# Patient Record
Sex: Female | Born: 1945
Health system: Southern US, Community
[De-identification: ages and names within clinical notes are randomized; demographics above are authoritative.]

## PROBLEM LIST (undated history)

## (undated) DIAGNOSIS — Z961 Presence of intraocular lens: Secondary | ICD-10-CM

## (undated) DIAGNOSIS — M810 Age-related osteoporosis without current pathological fracture: Secondary | ICD-10-CM

## (undated) DIAGNOSIS — D072 Carcinoma in situ of vagina: Secondary | ICD-10-CM

## (undated) DIAGNOSIS — A64 Unspecified sexually transmitted disease: Secondary | ICD-10-CM

## (undated) DIAGNOSIS — R87619 Unspecified abnormal cytological findings in specimens from cervix uteri: Secondary | ICD-10-CM

## (undated) DIAGNOSIS — I10 Essential (primary) hypertension: Secondary | ICD-10-CM

## (undated) HISTORY — PX: CATARACT EXTRACTION: SUR2

## (undated) HISTORY — DX: Presence of intraocular lens: Z96.1

## (undated) HISTORY — DX: Carcinoma in situ of vagina: D07.2

## (undated) HISTORY — DX: Unspecified sexually transmitted disease: A64

## (undated) HISTORY — DX: Essential (primary) hypertension: I10

## (undated) HISTORY — PX: FOOT SURGERY: SHX648

## (undated) HISTORY — DX: Unspecified abnormal cytological findings in specimens from cervix uteri: R87.619

## (undated) HISTORY — PX: TUBAL LIGATION: SHX77

## (undated) HISTORY — DX: Age-related osteoporosis without current pathological fracture: M81.0

## (undated) HISTORY — PX: MANDIBLE FRACTURE SURGERY: SHX706

## (undated) HISTORY — PX: COLPOSCOPY: SHX161

---

## 1998-06-09 ENCOUNTER — Other Ambulatory Visit: Admission: RE | Admit: 1998-06-09 | Discharge: 1998-06-09 | Payer: Self-pay | Admitting: *Deleted

## 1999-06-09 ENCOUNTER — Other Ambulatory Visit: Admission: RE | Admit: 1999-06-09 | Discharge: 1999-06-09 | Payer: Self-pay | Admitting: Obstetrics and Gynecology

## 2001-07-02 ENCOUNTER — Other Ambulatory Visit: Admission: RE | Admit: 2001-07-02 | Discharge: 2001-07-02 | Payer: Self-pay | Admitting: Obstetrics and Gynecology

## 2002-07-09 ENCOUNTER — Other Ambulatory Visit: Admission: RE | Admit: 2002-07-09 | Discharge: 2002-07-09 | Payer: Self-pay | Admitting: Obstetrics and Gynecology

## 2003-07-11 ENCOUNTER — Other Ambulatory Visit: Admission: RE | Admit: 2003-07-11 | Discharge: 2003-07-11 | Payer: Self-pay | Admitting: Obstetrics and Gynecology

## 2004-03-10 ENCOUNTER — Ambulatory Visit (HOSPITAL_COMMUNITY): Admission: RE | Admit: 2004-03-10 | Discharge: 2004-03-10 | Payer: Self-pay | Admitting: Gastroenterology

## 2004-08-31 ENCOUNTER — Other Ambulatory Visit: Admission: RE | Admit: 2004-08-31 | Discharge: 2004-08-31 | Payer: Self-pay | Admitting: Obstetrics and Gynecology

## 2005-08-30 ENCOUNTER — Other Ambulatory Visit: Admission: RE | Admit: 2005-08-30 | Discharge: 2005-08-30 | Payer: Self-pay | Admitting: Obstetrics and Gynecology

## 2006-09-06 ENCOUNTER — Encounter: Payer: Self-pay | Admitting: Obstetrics and Gynecology

## 2006-09-06 ENCOUNTER — Other Ambulatory Visit: Admission: RE | Admit: 2006-09-06 | Discharge: 2006-09-06 | Payer: Self-pay | Admitting: Obstetrics and Gynecology

## 2007-09-07 ENCOUNTER — Other Ambulatory Visit: Admission: RE | Admit: 2007-09-07 | Discharge: 2007-09-07 | Payer: Self-pay | Admitting: Obstetrics & Gynecology

## 2010-01-28 ENCOUNTER — Ambulatory Visit (HOSPITAL_COMMUNITY)
Admission: RE | Admit: 2010-01-28 | Discharge: 2010-01-28 | Payer: Self-pay | Source: Home / Self Care | Admitting: Endocrinology

## 2010-03-10 HISTORY — PX: OTHER SURGICAL HISTORY: SHX169

## 2010-08-06 NOTE — Op Note (Signed)
NAMEWANDRA, Renee Watkins                 ACCOUNT NO.:  [id number]   MEDICAL RECORD NO.:  [id number]          PATIENT TYPE:  AMB   LOCATION:  ENDO                         FACILITY:  [facility]   PHYSICIAN:  Danise Edge, M.D.   DATE OF BIRTH:  Apr 13, 1945   DATE OF PROCEDURE:  03/10/2004  DATE OF DISCHARGE:                                 OPERATIVE REPORT   PROCEDURE:  Colonoscopy.   INDICATIONS FOR PROCEDURE:  Renee Watkins is a 65 year old female born 05/30/1945.  Renee Watkins has guaiac-positive stool by stool guaiac testing.   ENDOSCOPIST:  Danise Edge, M.D.   PREMEDICATION:  Versed 5 mg, Demerol 60 mg.   PROCEDURE:  After obtaining informed consent, Renee Watkins was placed in the  left lateral decubitus position.  I administered intravenous Demerol and  intravenous Versed to achieve conscious sedation for the procedure.  The  patient's blood pressure, oxygen saturation, and cardiac rhythm were  monitored throughout the procedure and documented in the medical record.   Anal inspection and digital rectal exam was normal.  The Olympus adjustable  pediatric colonoscope was introduced into the rectum and advanced to the  cecum.  Colonic preparation for the exam today was excellent.   RECTUM:  Normal.   SIGMOID COLON/DESCENDING COLON:  Normal.   SPLENIC FLEXURE:  Normal.   TRANSVERSE COLON:  Normal.   HEPATIC FLEXURE:  Normal.   ASCENDING COLON:  Normal.   CECUM AND ILEOCECAL VALVE:  Normal.   ASSESSMENT:  Normal proctocolonoscopy of the cecum.      MJ/MEDQ  D:  03/10/2004  T:  03/10/2004  Job:  [id number]   cc:   Thora Lance, M.D.  [address]  [place]  [place] [address]  Fax: [phone]

## 2011-02-01 ENCOUNTER — Other Ambulatory Visit (HOSPITAL_COMMUNITY): Payer: Self-pay | Admitting: *Deleted

## 2011-02-03 ENCOUNTER — Encounter (HOSPITAL_COMMUNITY)
Admission: RE | Admit: 2011-02-03 | Discharge: 2011-02-03 | Disposition: A | Discharge status: Home / Self Care | Payer: BC Managed Care – PPO | Source: Ambulatory Visit | Attending: Endocrinology | Admitting: Endocrinology

## 2011-02-03 DIAGNOSIS — M810 Age-related osteoporosis without current pathological fracture: Secondary | ICD-10-CM | POA: Insufficient documentation

## 2011-02-03 MED ORDER — ZOLEDRONIC ACID 5 MG/100ML IV SOLN
5.0000 mg | Freq: Once | INTRAVENOUS | Status: AC
  Administered 2011-02-03: 5 mg via INTRAVENOUS
  Filled 2011-02-03: qty 100

## 2011-03-02 HISTORY — PX: OTHER SURGICAL HISTORY: SHX169

## 2012-03-08 HISTORY — PX: BUNIONECTOMY: SHX129

## 2012-03-08 HISTORY — PX: OTHER SURGICAL HISTORY: SHX169

## 2012-06-13 ENCOUNTER — Other Ambulatory Visit (HOSPITAL_COMMUNITY): Payer: Self-pay | Admitting: *Deleted

## 2012-06-14 ENCOUNTER — Ambulatory Visit (HOSPITAL_COMMUNITY)
Admission: RE | Admit: 2012-06-14 | Discharge: 2012-06-14 | Disposition: A | Discharge status: Home / Self Care | Payer: 59 | Source: Ambulatory Visit | Attending: Endocrinology | Admitting: Endocrinology

## 2012-06-14 ENCOUNTER — Encounter: Payer: Self-pay | Admitting: Podiatry

## 2012-06-14 DIAGNOSIS — M810 Age-related osteoporosis without current pathological fracture: Secondary | ICD-10-CM | POA: Insufficient documentation

## 2012-06-14 MED ORDER — ZOLEDRONIC ACID 5 MG/100ML IV SOLN
5.0000 mg | Freq: Once | INTRAVENOUS | Status: AC
  Administered 2012-06-14: 5 mg via INTRAVENOUS

## 2012-06-14 MED ORDER — ZOLEDRONIC ACID 5 MG/100ML IV SOLN
INTRAVENOUS | Status: AC
  Filled 2012-06-14: qty 100

## 2012-06-22 ENCOUNTER — Encounter: Payer: Self-pay | Admitting: Podiatry

## 2012-06-22 ENCOUNTER — Ambulatory Visit (INDEPENDENT_AMBULATORY_CARE_PROVIDER_SITE_OTHER): Payer: 59 | Admitting: Podiatry

## 2012-06-22 VITALS — BP 113/53 | HR 77 | Ht 64.0 in | Wt 107.0 lb

## 2012-06-22 DIAGNOSIS — B351 Tinea unguium: Secondary | ICD-10-CM

## 2012-06-22 NOTE — Patient Instructions (Addendum)
Seen for fungal nails. Need good trimming. Please brush nail border after each shower and keep dry. May use Tinactin foot power. Return as needed.

## 2012-06-22 NOTE — Progress Notes (Signed)
Subjective: 67 y.o. year old female patient presents complaining of deformed toe nail on left great toe. Patient has had right foot bunionectomy in December 2013 with satisfactory recovery, and back to work now.   Patient Summary List & History reviewed for allergies, medications, medical problems and surgical history.  Review of Systems - General ROS: negative for - chills, fatigue, fever, hot flashes, malaise, night sweats, sleep disturbance, weight gain or weight loss.  Objective: Dermatologic:  Thick yellow deformed nail at distal end with yellow debris left great toe. Proximal nail plate is normal shape and color. All other nails are mildly hypertrophic. Post surgical site of right foot is doing well. Noted of increased stiffness along the incision area.   Vascular: All pedal pulses are palpable. No edema or erythema, no bnormal findings noted.  Orthopedic:  Status post right bunionectomy with maintained correction. Multiple scar contracture from previous digital surgeries on left lesser digits.  Neurologic:  All epicritic and tactile sensations grossly intact.  Assessment: Dystrophic mycotic nail left hallux.  Treatment: Deformed nails debrided. Home care instruction dispensed. Return as needed.

## 2012-09-24 ENCOUNTER — Encounter: Payer: Self-pay | Admitting: Certified Nurse Midwife

## 2012-09-27 ENCOUNTER — Encounter: Payer: Self-pay | Admitting: Certified Nurse Midwife

## 2012-09-27 ENCOUNTER — Ambulatory Visit (INDEPENDENT_AMBULATORY_CARE_PROVIDER_SITE_OTHER): Payer: 59 | Admitting: Certified Nurse Midwife

## 2012-09-27 VITALS — BP 114/70 | HR 68 | Resp 16 | Ht 63.75 in | Wt 111.0 lb

## 2012-09-27 DIAGNOSIS — Z01419 Encounter for gynecological examination (general) (routine) without abnormal findings: Secondary | ICD-10-CM

## 2012-09-27 DIAGNOSIS — Z0000 Encounter for general adult medical examination without abnormal findings: Secondary | ICD-10-CM

## 2012-09-27 LAB — POCT URINALYSIS DIPSTICK
Blood, UA: NEGATIVE
Nitrite, UA: NEGATIVE
Urobilinogen, UA: NEGATIVE
pH, UA: 5

## 2012-09-27 NOTE — Patient Instructions (Signed)

## 2012-09-27 NOTE — Progress Notes (Signed)
67 y.o. [id number] Divorced Asian Fe here for annual exam.   Menopausal no HRT. Denies vaginal bleeding or vaginal dryness. Sees PCP for aex and labs, normal per patient. Hypertension medication change due to hypotension with Lisinopril. PCP management.  No health issues today.  Patient's last menstrual period was 03/22/2003.          Sexually active: no  The current method of family planning is tubal ligation.    Exercising: yes  walk Smoker:  no  Health Maintenance: Pap:09-23-10 neg MMG: 09-13-11 neg Had this am report not in epic Colonoscopy: 2006 IFOB per patient with PCP BMD:  2013  TDaP:  2010 Labs: Poct urine-neg Self breast exam: not done   reports that she has never smoked. She has never used smokeless tobacco. She reports that she does not drink alcohol or use illicit drugs.  Past Medical History  Diagnosis Date  . Hypertension   . Osteoporosis   . STD (sexually transmitted disease)     HSV    Past Surgical History  Procedure Laterality Date  . Foot surgery Right 2000, 2009  . Foot surgery Left 2002, 2006, 2010  . Hammer toe repair Right 03/10/10    right foot # 5  . Base wedge osteomy Right 03/10/10    right foot  . Cotton ost w/graft Right 03/10/10    right foot  . Hammer toe repair Left 03/02/11    left foot 3 . 5  . Capsulotomy ipj Right 03/08/12    Right foot 2  . Bunionectomy Right 03/08/12    Keller right foot  . Tubal ligation    . Mandible fracture surgery      Current Outpatient Prescriptions  Medication Sig Dispense Refill  . calcium carbonate (OS-CAL) 600 MG TABS Take 600 mg by mouth daily.      . Calcium-Vitamin D-Vitamin K 500-500-40 MG-UNT-MCG CHEW Chew by mouth daily.      Marland Kitchen lisinopril (PRINIVIL,ZESTRIL) 10 MG tablet daily.      . Multiple Vitamin (MULTIVITAMIN) tablet Take 1 tablet by mouth daily.      . valACYclovir (VALTREX) 500 MG tablet as needed.      . Vitamin D, Ergocalciferol, (DRISDOL) 50000 UNITS CAPS Take 50,000 Units by mouth  every 30 (thirty) days.       No current facility-administered medications for this visit.    Family History  Problem Relation Age of Onset  . Hypertension Sister   . Hypertension Brother   . Hypertension Mother     ROS:  Pertinent items are noted in HPI.  Otherwise, a comprehensive ROS was negative.  Exam:   BP 114/70  Pulse 68  Resp 16  Ht 5' 3.75" (1.619 m)  Wt 111 lb (50.349 kg)  BMI 19.21 kg/m2  LMP 03/22/2003 Height: 5' 3.75" (161.9 cm)  Ht Readings from Last 3 Encounters:  09/27/12 5' 3.75" (1.619 m)  06/22/12 5\' 4"  (1.626 m)  06/14/12 5\' 2"  (1.575 m)    General appearance: alert, cooperative and appears stated age Head: Normocephalic, without obvious abnormality, atraumatic Neck: no adenopathy, supple, symmetrical, trachea midline and thyroid normal to inspection and palpation Lungs: clear to auscultation bilaterally Breasts: normal appearance, no masses or tenderness, No nipple retraction or dimpling, No nipple discharge or bleeding, No axillary or supraclavicular adenopathy Heart: regular rate and rhythm Abdomen: soft, non-tender; no masses,  no organomegaly Extremities: extremities normal, atraumatic, no cyanosis or edema Skin: Skin color, texture, turgor normal. No rashes or lesions Lymph  nodes: Cervical, supraclavicular, and axillary nodes normal. No abnormal inguinal nodes palpated Neurologic: Grossly normal   Pelvic: External genitalia:  no lesions              Urethra:  normal appearing urethra with no masses, tenderness or lesions              Bartholin's and Skene's: normal                 Vagina: normal appearing vagina with normal color and discharge, no lesions              Cervix: normal, non tender              Pap taken: no Bimanual Exam:  Uterus:  normal size, contour, position, consistency, mobility, non-tender and anteverted              Adnexa: normal adnexa and no mass, fullness, tenderness               Rectovaginal: Confirms                Anus:  normal sphincter tone, no lesions  A:  Well Woman with normal exam  Menopausal not on HRT  Hypertension with PCP management  Osteoporosis with endocrine management with Reclast use  P:   Reviewed health and wellness pertinent to exam  Discussed importance of evaluation if vaginal bleeding occurs.Patient voice understanding.  Continue follow upas indicated with PCP   Pap smear as per guidelines   Mammogram yearly pap smear not taken today  counseled on breast self exam, mammography screening, menopause, osteoporosis, adequate intake of calcium and vitamin D, diet and exercise, Kegel's exercises  return annually or prn  An After Visit Summary was printed and given to the patient.  Reviewed, TL

## 2013-01-22 ENCOUNTER — Other Ambulatory Visit: Payer: Self-pay | Admitting: Internal Medicine

## 2013-01-22 ENCOUNTER — Ambulatory Visit
Admission: RE | Admit: 2013-01-22 | Discharge: 2013-01-22 | Disposition: A | Discharge status: Home / Self Care | Payer: 59 | Source: Ambulatory Visit | Attending: Internal Medicine | Admitting: Internal Medicine

## 2013-01-22 DIAGNOSIS — M545 Low back pain, unspecified: Secondary | ICD-10-CM

## 2013-03-01 ENCOUNTER — Ambulatory Visit: Payer: 59

## 2013-03-01 ENCOUNTER — Ambulatory Visit (INDEPENDENT_AMBULATORY_CARE_PROVIDER_SITE_OTHER): Payer: 59

## 2013-03-01 VITALS — BP 123/83 | HR 94 | Resp 18

## 2013-03-01 DIAGNOSIS — M79609 Pain in unspecified limb: Secondary | ICD-10-CM

## 2013-03-01 DIAGNOSIS — M2011 Hallux valgus (acquired), right foot: Secondary | ICD-10-CM

## 2013-03-01 DIAGNOSIS — M2020 Hallux rigidus, unspecified foot: Secondary | ICD-10-CM

## 2013-03-01 DIAGNOSIS — M2021 Hallux rigidus, right foot: Secondary | ICD-10-CM

## 2013-03-01 DIAGNOSIS — M2010 Hallux valgus (acquired), unspecified foot: Secondary | ICD-10-CM

## 2013-03-01 DIAGNOSIS — M1990 Unspecified osteoarthritis, unspecified site: Secondary | ICD-10-CM

## 2013-03-01 NOTE — Patient Instructions (Signed)
Pre-Operative Instructions  Congratulations, you have decided to take an important step to improving your quality of life.  You can be assured that the doctors of [facility] will be with you every step of the way.  1. Plan to be at the surgery center/hospital at least 1 (one) hour prior to your scheduled time unless otherwise directed by the surgical center/hospital staff.  You must have a responsible adult accompany you, remain during the surgery and drive you home.  Make sure you have directions to the surgical center/hospital and know how to get there on time. 2. For hospital based surgery you will need to obtain a history and physical form from your family physician within 1 month prior to the date of surgery- we will give you a form for you primary physician.  3. We make every effort to accommodate the date you request for surgery.  There are however, times where surgery dates or times have to be moved.  We will contact you as soon as possible if a change in schedule is required.   4. No Aspirin/Ibuprofen for one week before surgery.  If you are on aspirin, any non-steroidal anti-inflammatory medications (Mobic, Aleve, Ibuprofen) you should stop taking it 7 days prior to your surgery.  You make take Tylenol  For pain prior to surgery.  5. Medications- If you are taking daily heart and blood pressure medications, seizure, reflux, allergy, asthma, anxiety, pain or diabetes medications, make sure the surgery center/hospital is aware before the day of surgery so they may notify you which medications to take or avoid the day of surgery. 6. No food or drink after midnight the night before surgery unless directed otherwise by surgical center/hospital staff. 7. No alcoholic beverages 24 hours prior to surgery.  No smoking 24 hours prior to or 24 hours after surgery. 8. Wear loose pants or shorts- loose enough to fit over bandages, boots, and casts. 9. No slip on shoes, sneakers are best. 10. Bring  your boot with you to the surgery center/hospital.  Also bring crutches or a walker if your physician has prescribed it for you.  If you do not have this equipment, it will be provided for you after surgery. 11. If you have not been contracted by the surgery center/hospital by the day before your surgery, call to confirm the date and time of your surgery. 12. Leave-time from work may vary depending on the type of surgery you have.  Appropriate arrangements should be made prior to surgery with your employer. 13. Prescriptions will be provided immediately following surgery by your doctor.  Have these filled as soon as possible after surgery and take the medication as directed. 14. Remove nail polish on the operative foot. 15. Wash the night before surgery.  The night before surgery wash the foot and leg well with the antibacterial soap provided and water paying special attention to beneath the toenails and in between the toes.  Rinse thoroughly with water and dry well with a towel.  Perform this wash unless told not to do so by your physician.  Enclosed: 1 Ice pack (please put in freezer the night before surgery)   1 Hibiclens skin cleaner   Pre-op Instructions  If you have any questions regarding the instructions, do not hesitate to call our office.  [place]: [address] [place], [place] [address] [phone]  [place]: [address] [place], [place] [address] [phone]  [place]: [address]  , [place] [address] [phone]  Dr. Ashli Selders   Tuchman DPM, Dr. Norman Regal DPM Dr. Pebbles Zeiders DPM, Dr. M. Todd Hyatt DPM, Dr. Kathryn Egerton DPM 

## 2013-03-01 NOTE — Progress Notes (Signed)
   Subjective:    Patient ID: Renee Watkins, female    DOB: September 01, 1945, 67 y.o.   MRN: [id number]  HPI my right foot had surgery on it two times and was done by Dr Raynald Kemp and the big toe goes up and doesn't want to lay done and hurts with shoes and has been going on for about 3 months ago    Review of Systems  Constitutional: Negative.   HENT: Negative.   Eyes: Negative.   Respiratory: Negative.   Cardiovascular: Negative.   Gastrointestinal: Negative.   Endocrine: Negative.   Genitourinary: Negative.   Musculoskeletal: Negative.   Skin: Negative.   Allergic/Immunologic: Negative.   Neurological: Negative.   Hematological: Bruises/bleeds easily.  Psychiatric/Behavioral: Negative.        Objective:   Physical Exam Vascular status is intact with pedal pulses palpable DP +2/4 bilateral PT plus one over 4 bilateral. Capillary fill time 3 seconds all digits. Skin temperature warm turgor normal no edema rubor noted mild varicosities noted. Neurologically epicritic and proprioceptive sensations appear to be intact and symmetric bilateral. There is normal plantar response DTRs not elicited. Dermatologically skin color pigment normal hair growth absent nails somewhat criptotic. Bare patient has had multiple surgeries for hammertoe repair digital contractures as well as previous bunion surgeries significant for recent bunion surgeries on her right first MTP joint. Past surgeries were done by Dr. Salli Real. X-rays reviewed at this time reveal residual hallux abductovalgus deformity as well as hallux rigidus and lateral deviation of hallux. Patient has undergone capital fragment osteotomy as well and discolored bunionectomy as she's had multiple surgeries on the great toe joint. The toe clinically is in a lateral deviation pushing up against the second digit and overlapping the second toe right foot. Patient difficulty with ambulation and with any enclosed shoes as a result of this deformity. It is  semirigid in nonreducible. She's tried toe separators silicone pads cushions and changing shoes with no success. Patient still has a retained screw fixation of the second digit which also underwent hammertoe repair patient's other digits on his foot as well as digits on the contralateral foot show no deformities and abnormalities consistent with osteoarthritic changes. There is no open wounds or ulcerations noted current time no secondary infection is noted.       Assessment & Plan:  Assessment this time is hallux rigidus with osteoarthropathy and hallux valgus deformity first MTP area right foot. Patient apparently said failed previous surgeries and has residual lateral deviation of the hallux and overlapping of the second toe. Based on her history of arthropathy as well as recurrent valgus deformity and osteoarthritis my recommendation at this time is either conservative care with toe separators padding and accommodative shoes which was explained to the patient. Patient declined this option and instead requested surgical correction which I explained to her would involve arthrodesis or fusion of the MTP joint with screw placement patient understands she will lose flexed any residual flexibility of the toe however Tobia in a permanent rectus position with slight elevation. Patient at this time request surgery and a consent form for first MTP fusion with screw fixation is reviewed and signed. All questions asked medication are answered there no contraindications to surgery at this time. Surgery scheduled her convenience with appropriate followup thereafter.  Alvan Dame DPM

## 2013-03-06 ENCOUNTER — Telehealth: Payer: Self-pay | Admitting: *Deleted

## 2013-03-06 NOTE — Telephone Encounter (Signed)
Pt want to know what kind of shoe will she be wearing after surgery.  I told her she will be wearing aair fracture walker boot to the knee, then later transfer to a surgical sandal.  Pt wanted what she could wear after the hammer toe was fixed.  I told her she wouldn't be able to wear heels higher than a athletic shoe heel and stiffer shoes.

## 2013-03-06 NOTE — Telephone Encounter (Signed)
Pt states she has questions she couldn't ask Dr Ralene Cork.

## 2013-03-28 ENCOUNTER — Encounter: Payer: Self-pay | Admitting: Podiatry

## 2013-03-28 ENCOUNTER — Ambulatory Visit (INDEPENDENT_AMBULATORY_CARE_PROVIDER_SITE_OTHER): Payer: 59 | Admitting: Podiatry

## 2013-03-28 VITALS — BP 132/69 | HR 86 | Resp 12

## 2013-03-28 DIAGNOSIS — M2021 Hallux rigidus, right foot: Secondary | ICD-10-CM

## 2013-03-28 DIAGNOSIS — M2020 Hallux rigidus, unspecified foot: Secondary | ICD-10-CM

## 2013-03-28 NOTE — Progress Notes (Signed)
   Subjective:    Patient ID: Renee Watkins, female    DOB: [birth year], 68 y.o.   MRN: [id number]  HPI Comments: '' RT FOOT BIG TOE STILL HURTS.''  Toe Pain       Review of Systems     Objective:   Physical Exam: I have reviewed her past medical history medications allergies surgeries and social history. She still complaining of a painful first metatarsophalangeal joint right foot. I have read Dr. Erasmo Downer findings and agree. Previous surgeries have now left this metatarsophalangeal joint with a malalignment and bone to bone contact the first metatarsal is staked. After reviewing the radiographs this is my opinion and arthrodesis would be necessary to eliminate her symptoms.        Assessment & Plan:  Assessment: Osteoarthritis first metatarsophalangeal joint right foot.  Plan: Suggested that she followup with Dr. Blenda Mounts for a arthrodesis of her first metatarsophalangeal joint.

## 2013-04-12 ENCOUNTER — Ambulatory Visit (INDEPENDENT_AMBULATORY_CARE_PROVIDER_SITE_OTHER): Payer: 59

## 2013-04-12 VITALS — BP 126/67 | HR 85 | Resp 16

## 2013-04-12 DIAGNOSIS — M2010 Hallux valgus (acquired), unspecified foot: Secondary | ICD-10-CM

## 2013-04-12 DIAGNOSIS — Z472 Encounter for removal of internal fixation device: Secondary | ICD-10-CM

## 2013-04-12 DIAGNOSIS — M79609 Pain in unspecified limb: Secondary | ICD-10-CM

## 2013-04-12 DIAGNOSIS — M2020 Hallux rigidus, unspecified foot: Secondary | ICD-10-CM

## 2013-04-12 DIAGNOSIS — M1990 Unspecified osteoarthritis, unspecified site: Secondary | ICD-10-CM

## 2013-04-12 NOTE — Progress Notes (Signed)
   Subjective:    Patient ID: Renee Watkins, female    DOB: Oct 11, 1945, 68 y.o.   MRN: [id number]  HPI Comments: "My right big toe still bothers me. Guess I will talk about doing the surgery."  patient been see by myself as well as Dr. Milinda Pointer who concurred with my original recommendation for first MTP joint fusion right foot.    Review of Systems no new changes or findings at this time     Objective:   Physical Exam Vascular status at the time is intact pedal pulses are palpable epicritic and proprioceptive sensations intact and symmetric bilateral. There is normal plantar response and DTRs. Patient continues to have pain lateral deviation of the hallux at first MTP joint right foot x-ray from previous visits are reviewed revealing previous Liane Comber as well as Jake Michaelis bunionectomy procedures which appeared to fail to resolve the problem continues have lateral deviation of the hallux. Patient is also having pain in distal tuft the second digit where has retained screw fixation and hammertoe repair second right the option of removing the screw was also offered to the patient at this time. The end of the toe is bumping up and her shoes and causing some irritation keratoses. X-rays confirm it may be prominence of the screw head. There are no contraindications to surgery at this time we discussed the surgery the risks complications and alternatives patient understands she will be nonweightbearing for 4-6 week. Utilizing the air fracture boot and crutches she will be in the boot for a total of of 8-10 weeks for healing       Assessment & Plan:  Assessments at this time hallux rigidus and osteoarthropathy first MTP area right foot with residual hallux valgus deformity. Patient also some irritation from screw fixation second toe right foot. At this time consent forms are reviewed on the new consent form for first MTP fusion with screw fixation right foot as well as removal of old fixation screw second toe right  foot consent forms are reviewed and signed all questions asked medication are answered there no contraindications of surgery we scheduled at her convenience. Again nonweightbearing for 4-6 week duration in air fracture boot understands she will have a rigid hallux MTP joint.  Harriet Masson DPM

## 2013-04-12 NOTE — Patient Instructions (Addendum)
Pre-Operative Instructions  Congratulations, you have decided to take an important step to improving your quality of life.  You can be assured that the doctors of [facility] will be with you every step of the way.  1. Plan to be at the surgery center/hospital at least 1 (one) hour prior to your scheduled time unless otherwise directed by the surgical center/hospital staff.  You must have a responsible adult accompany you, remain during the surgery and drive you home.  Make sure you have directions to the surgical center/hospital and know how to get there on time. 2. For hospital based surgery you will need to obtain a history and physical form from your family physician within 1 month prior to the date of surgery- we will give you a form for you primary physician.  3. We make every effort to accommodate the date you request for surgery.  There are however, times where surgery dates or times have to be moved.  We will contact you as soon as possible if a change in schedule is required.   4. No Aspirin/Ibuprofen for one week before surgery.  If you are on aspirin, any non-steroidal anti-inflammatory medications (Mobic, Aleve, Ibuprofen) you should stop taking it 7 days prior to your surgery.  You make take Tylenol  For pain prior to surgery.  5. Medications- If you are taking daily heart and blood pressure medications, seizure, reflux, allergy, asthma, anxiety, pain or diabetes medications, make sure the surgery center/hospital is aware before the day of surgery so they may notify you which medications to take or avoid the day of surgery. 6. No food or drink after midnight the night before surgery unless directed otherwise by surgical center/hospital staff. 7. No alcoholic beverages 24 hours prior to surgery.  No smoking 24 hours prior to or 24 hours after surgery. 8. Wear loose pants or shorts- loose enough to fit over bandages, boots, and casts. 9. No slip on shoes, sneakers are best. 10. Bring  your boot with you to the surgery center/hospital.  Also bring crutches or a walker if your physician has prescribed it for you.  If you do not have this equipment, it will be provided for you after surgery. 11. If you have not been contracted by the surgery center/hospital by the day before your surgery, call to confirm the date and time of your surgery. 12. Leave-time from work may vary depending on the type of surgery you have.  Appropriate arrangements should be made prior to surgery with your employer. 13. Prescriptions will be provided immediately following surgery by your doctor.  Have these filled as soon as possible after surgery and take the medication as directed. 14. Remove nail polish on the operative foot. 15. Wash the night before surgery.  The night before surgery wash the foot and leg well with the antibacterial soap provided and water paying special attention to beneath the toenails and in between the toes.  Rinse thoroughly with water and dry well with a towel.  Perform this wash unless told not to do so by your physician.  Enclosed: 1 Ice pack (please put in freezer the night before surgery)   1 Hibiclens skin cleaner may substitute any antibacterial liquid soap or cleanser   Pre-op Instructions  If you have any questions regarding the instructions, do not hesitate to call our office.  [place]: [place], [place] [address] [place]: [address], [place], [place] [address] [phone]  [place]: [address]  [place], [place] [address] [phone]  Dr. Kendell Bane DPM, Dr. Ila Mcgill DPM Dr. Harriet Masson DPM, Dr. Lanelle Bal DPM, Dr. Trudie Buckler [facility] Instructions  Congratulations, you have decided to take an important step to improving your quality of life.  You can be assured that the doctors of [facility] will be with you every step of the way.  16. Plan to be at the surgery center/hospital at least 1 (one) hour  prior to your scheduled time unless otherwise directed by the surgical center/hospital staff.  You must have a responsible adult accompany you, remain during the surgery and drive you home.  Make sure you have directions to the surgical center/hospital and know how to get there on time. 53. For hospital based surgery you will need to obtain a history and physical form from your family physician within 1 month prior to the date of surgery- we will give you a form for you primary physician.  18. We make every effort to accommodate the date you request for surgery.  There are however, times where surgery dates or times have to be moved.  We will contact you as soon as possible if a change in schedule is required.   19. No Aspirin/Ibuprofen for one week before surgery.  If you are on aspirin, any non-steroidal anti-inflammatory medications (Mobic, Aleve, Ibuprofen) you should stop taking it 7 days prior to your surgery.  You make take Tylenol  For pain prior to surgery.  20. Medications- If you are taking daily heart and blood pressure medications, seizure, reflux, allergy, asthma, anxiety, pain or diabetes medications, make sure the surgery center/hospital is aware before the day of surgery so they may notify you which medications to take or avoid the day of surgery. 21. No food or drink after midnight the night before surgery unless directed otherwise by surgical center/hospital staff. 22. No alcoholic beverages 24 hours prior to surgery.  No smoking 24 hours prior to or 24 hours after surgery. 23. Wear loose pants or shorts- loose enough to fit over bandages, boots, and casts. 24. No slip on shoes, sneakers are best. 25. Bring your boot with you to the surgery center/hospital.  Also bring crutches or a walker if your physician has prescribed it for you.  If you do not have this equipment, it will be provided for you after surgery. 41. If you have not been contracted by the surgery center/hospital by the day  before your surgery, call to confirm the date and time of your surgery. 52. Leave-time from work may vary depending on the type of surgery you have.  Appropriate arrangements should be made prior to surgery with your employer. 28. Prescriptions will be provided immediately following surgery by your doctor.  Have these filled as soon as possible after surgery and take the medication as directed. 29. Remove nail polish on the operative foot. 49. Wash the night before surgery.  The night before surgery wash the foot and leg well with the antibacterial soap provided and water paying special attention to beneath the toenails and in between the toes.  Rinse thoroughly with water and dry well with a towel.  Perform this wash unless told not to do so by your physician.  Enclosed: 1 Ice pack (please put in freezer the night before surgery)   1 Hibiclens skin cleaner   Pre-op Instructions  If you have any questions regarding the instructions, do not hesitate to call our office.  [place]: [place].  [place], [place] [address] [place]: [address] [place], [place] [address] [phone]  [place]: [address] [place] [address] [phone]  Dr. Kendell Bane DPM, Dr. Ila Mcgill DPM Dr. Harriet Masson DPM, Dr. Lanelle Bal DPM, Dr. Trudie Buckler DPM

## 2013-04-25 DIAGNOSIS — M2020 Hallux rigidus, unspecified foot: Secondary | ICD-10-CM

## 2013-05-13 DIAGNOSIS — Z472 Encounter for removal of internal fixation device: Secondary | ICD-10-CM

## 2013-05-13 DIAGNOSIS — M2020 Hallux rigidus, unspecified foot: Secondary | ICD-10-CM

## 2013-05-13 DIAGNOSIS — M19079 Primary osteoarthritis, unspecified ankle and foot: Secondary | ICD-10-CM

## 2013-05-21 ENCOUNTER — Ambulatory Visit (INDEPENDENT_AMBULATORY_CARE_PROVIDER_SITE_OTHER): Payer: 59

## 2013-05-21 VITALS — BP 137/84 | HR 88 | Resp 12

## 2013-05-21 DIAGNOSIS — M2020 Hallux rigidus, unspecified foot: Secondary | ICD-10-CM

## 2013-05-21 DIAGNOSIS — Z9889 Other specified postprocedural states: Secondary | ICD-10-CM

## 2013-05-21 DIAGNOSIS — Z472 Encounter for removal of internal fixation device: Secondary | ICD-10-CM

## 2013-05-21 DIAGNOSIS — M1990 Unspecified osteoarthritis, unspecified site: Secondary | ICD-10-CM

## 2013-05-21 DIAGNOSIS — M2010 Hallux valgus (acquired), unspecified foot: Secondary | ICD-10-CM

## 2013-05-21 NOTE — Patient Instructions (Signed)

## 2013-05-21 NOTE — Progress Notes (Signed)
   Subjective:    Patient ID: Renee Watkins, female    DOB: 02-12-1946, 68 y.o.   MRN: [id number]  HPI Comments: "  DOS 05-13-2013 POV Hallux fusion and removal screw 2nd toe right      Review of Systems no new changes or find     Objective:   Physical Exam Patient is a day status post hallux fusion right as well as removal screw fixation second right x-rays reveal good position the osteotomy intact fixations noted absence of fixation second toe is noted. Sutures intact and dry dressings intact and dry presto compressive dressing reapplied at this time. Maintain nonweightbearing for lesion of the 3-4 more weeks patient and leg with her air fracture boot and crutches as instructed. Is good clinical and radiographic alignment noted this time no dehiscence minimal ecchymosis mild edema consistent with postop course to       Assessment & Plan:  Assessment good postop progress reappointed one week plan for suture removal at that time maintain nonweightbearing as instructed keep the dressings clean and dry patient's daughter is with her at visit we discussed taking aspirin a day to prevent any clots etc. I suggest either the Advil a day or an aspirin days beneficial. Reappointed one week for suture removal  Harriet Masson DPM

## 2013-05-28 ENCOUNTER — Ambulatory Visit (INDEPENDENT_AMBULATORY_CARE_PROVIDER_SITE_OTHER): Payer: 59

## 2013-05-28 VITALS — BP 128/57 | HR 86 | Resp 12

## 2013-05-28 DIAGNOSIS — Z9889 Other specified postprocedural states: Secondary | ICD-10-CM

## 2013-05-28 DIAGNOSIS — M2020 Hallux rigidus, unspecified foot: Secondary | ICD-10-CM

## 2013-05-28 DIAGNOSIS — M1990 Unspecified osteoarthritis, unspecified site: Secondary | ICD-10-CM

## 2013-05-28 NOTE — Patient Instructions (Addendum)
ICE INSTRUCTIONS  Apply ice or cold pack to the affected area at least 3 times a day for 10-15 minutes each time.  You should also use ice after prolonged activity or vigorous exercise.  Do not apply ice longer than 20 minutes at one time.  Always keep a cloth between your skin and the ice pack to prevent burns.  Being consistent and following these instructions will help control your symptoms.  We suggest you purchase a gel ice pack because they are reusable and do bit leak.  Some of them are designed to wrap around the area.  Use the method that works best for you.  Here are some other suggestions for icing.   Use a frozen bag of peas or corn-inexpensive and molds well to your body, usually stays frozen for 10 to 20 minutes.  Wet a towel with cold water and squeeze out the excess until it's damp.  Place in a bag in the freezer for 20 minutes. Then remove and use.   Maintain nonweightbearing for at least 3 weeks at this time. May resume normal bathing and hygiene starting tomorrow wash and dry the foot however no weightbearing without the boot on use crutches at all times . Followup in 3-4 weeks for followup x-rays

## 2013-05-28 NOTE — Progress Notes (Signed)
   Subjective:    Patient ID: Renee Watkins, female    DOB: 1945/12/16, 68 y.o.   MRN: [id number]  HPI Comments:   DOS 05-13-2013 POV Hallux fusion and screw removal 2nd toe right Suture removal     Review of Systems     Objective:   Physical Exam        Assessment & Plan:

## 2013-05-28 NOTE — Progress Notes (Signed)
   Subjective:    Patient ID: Nohelia Valenza, female    DOB: [birth year], 68 y.o.   MRN: [id number]  HPI DOS 05-13-2013 POV Hallux fusion and screw removal 2nd toe right '' rt foot is doing ok.''    Review of Systems no new changes or findings     Objective:   Physical Exam Neurovascular status is intact pedal pulses palpable epicritic and proprioceptive sensations intact incision is well coapted sutures second toe are removed at this time. Distal tuft second toe. Ace anklet is dispensed to maintain compression patient will resume normal bathing and hygiene our maintain nonweightbearing for at least 2-4 weeks until instructed followup again likely in 3 weeks for followup x-rays and reassessment. No dehiscence no discharge or drainage no signs of secondary infection noted       Assessment & Plan:  Assessment good postop progress status post first MTP fusion as well as removal screw fixation second toe maintain nonweightbearing and compressive wrapping the anklet dispensed at this time recheck in 3 or 4 weeks for followup and reassessment maintain nonweightbearing as instructed  Harriet Masson DPM

## 2013-07-08 ENCOUNTER — Ambulatory Visit (INDEPENDENT_AMBULATORY_CARE_PROVIDER_SITE_OTHER): Payer: 59

## 2013-07-08 VITALS — BP 126/81 | HR 81 | Resp 15 | Ht 64.0 in | Wt 110.0 lb

## 2013-07-08 DIAGNOSIS — Z09 Encounter for follow-up examination after completed treatment for conditions other than malignant neoplasm: Secondary | ICD-10-CM

## 2013-07-08 DIAGNOSIS — Z9889 Other specified postprocedural states: Secondary | ICD-10-CM

## 2013-07-08 DIAGNOSIS — M2020 Hallux rigidus, unspecified foot: Secondary | ICD-10-CM

## 2013-07-08 NOTE — Progress Notes (Signed)
   Subjective:    Patient ID: Kenslei Hearty, female    DOB: [birth year], 68 y.o.   MRN: [id number]  HPI patient presents this time for postop visit she is 2 months status post first MTP fusion with screw fixation right great toe removal screw fixation second digit right foot. Patient still using the boot and crutches has been nonweightbearing as instructed    Review of Systems any systemic findings or changes noted     Objective:   Physical Exam Neurovascular status is intact pedal pulses palpable house is rectus as the second digit x-rays reveal intact fixation and consolidation of osteotomy first MTP joint clinically there is good consolidation no movement the hallux is in a good rectus position slight elevation. Patient ready discontinue crutches and discontinue boot read return to work on May 18 without restrictions mesh wear shoes at all times.       Assessment & Plan:  Assessment good postop progress with clinical and radiographic alignment of the hallux is noted recheck in 2 months for long-term postop followup with x-ray contact us if any change difficulties interim. Maintain shoes at all times discontinue boot and crutches at this time.  Harriet Masson DPM

## 2013-07-08 NOTE — Patient Instructions (Signed)
May discontinue the boot and crutches at this time  Plan for return to work on May 18 with no significant restrictions however must wear shoes or athletic shoes or boots at all times cannot walk barefoot  Maintain a good athletic shoe or thick soled sandal or clog such as a Birkenstock or crocs

## 2013-07-12 DIAGNOSIS — Z9889 Other specified postprocedural states: Secondary | ICD-10-CM

## 2013-09-10 ENCOUNTER — Ambulatory Visit (INDEPENDENT_AMBULATORY_CARE_PROVIDER_SITE_OTHER): Payer: 59

## 2013-09-10 VITALS — BP 118/70 | HR 71 | Resp 15 | Ht 64.0 in | Wt 112.0 lb

## 2013-09-10 DIAGNOSIS — M19079 Primary osteoarthritis, unspecified ankle and foot: Secondary | ICD-10-CM

## 2013-09-10 DIAGNOSIS — M2020 Hallux rigidus, unspecified foot: Secondary | ICD-10-CM

## 2013-09-10 DIAGNOSIS — M2021 Hallux rigidus, right foot: Secondary | ICD-10-CM

## 2013-09-10 DIAGNOSIS — Z09 Encounter for follow-up examination after completed treatment for conditions other than malignant neoplasm: Secondary | ICD-10-CM

## 2013-09-10 NOTE — Progress Notes (Signed)
   Subjective:    Patient ID: Renee Watkins, female    DOB: [birth year], 68 y.o.   MRN: [id number]  HPI Comments: Pt states she is doing good, but has swelling at the end of a 8 hour work day.     Review of Systems no new findings or systemic changes noted     Objective:   Physical Exam Neurovascular status is intact pedal pulses palpable bilateral epicritic and proprioceptive sensations intact patient's four-month status post first MTP fusion right foot also removal of K wire screw fixation second toe right foot patient's only complaint this time after standing hour she is still swelling in her feet and also slight hammering of the third toe which has some slight prominence of flexible hammertoe contractures noted hours not painful tender symptomatic. Recommended compression stockings whenever possible       Assessment & Plan:  Assessment good postop progress x-rays will be consolidation of the first MTP fusion with screw stable fixation intact. No pain at the great toe joint slight edema noted at times especially for prolonged standing and he hour shifts. Patient discharged from our care at this time did make recommendations for knee-high support hose whenever possible.  Harriet Masson DPM

## 2013-09-10 NOTE — Patient Instructions (Addendum)
ICE INSTRUCTIONS  Apply ice or cold pack to the affected area at least 3 times a day for 10-15 minutes each time.  You should also use ice after prolonged activity or vigorous exercise.  Do not apply ice longer than 20 minutes at one time.  Always keep a cloth between your skin and the ice pack to prevent burns.  Being consistent and following these instructions will help control your symptoms.  We suggest you purchase a gel ice pack because they are reusable and do bit leak.  Some of them are designed to wrap around the area.  Use the method that works best for you.  Here are some other suggestions for icing.   Use a frozen bag of peas or corn-inexpensive and molds well to your body, usually stays frozen for 10 to 20 minutes.  Wet a towel with cold water and squeeze out the excess until it's damp.  Place in a bag in the freezer for 20 minutes. Then remove and use.  Also recommend wearing support hose whenever possible. This will help reduce the swelling while standing for 8 hour day

## 2013-09-25 ENCOUNTER — Telehealth: Payer: Self-pay | Admitting: Certified Nurse Midwife

## 2013-09-25 NOTE — Telephone Encounter (Signed)
Patient called during lunch wanting to reschedule her upcoming appt on 09/30/13 to 10/02/13. But Debbi is off that day. [email or web address]

## 2013-09-25 NOTE — Telephone Encounter (Signed)
Patient confirmed her appointment 09/30/13 .

## 2013-09-30 ENCOUNTER — Ambulatory Visit (INDEPENDENT_AMBULATORY_CARE_PROVIDER_SITE_OTHER): Payer: 59 | Admitting: Certified Nurse Midwife

## 2013-09-30 ENCOUNTER — Encounter: Payer: Self-pay | Admitting: Certified Nurse Midwife

## 2013-09-30 VITALS — BP 100/64 | HR 80 | Resp 16 | Ht 63.75 in | Wt 113.0 lb

## 2013-09-30 DIAGNOSIS — Z01419 Encounter for gynecological examination (general) (routine) without abnormal findings: Secondary | ICD-10-CM

## 2013-09-30 DIAGNOSIS — R87619 Unspecified abnormal cytological findings in specimens from cervix uteri: Secondary | ICD-10-CM

## 2013-09-30 DIAGNOSIS — Z124 Encounter for screening for malignant neoplasm of cervix: Secondary | ICD-10-CM

## 2013-09-30 DIAGNOSIS — N841 Polyp of cervix uteri: Secondary | ICD-10-CM

## 2013-09-30 DIAGNOSIS — Z0000 Encounter for general adult medical examination without abnormal findings: Secondary | ICD-10-CM

## 2013-09-30 HISTORY — DX: Unspecified abnormal cytological findings in specimens from cervix uteri: R87.619

## 2013-09-30 LAB — POCT URINALYSIS DIPSTICK
BILIRUBIN UA: NEGATIVE
Blood, UA: NEGATIVE
Glucose, UA: NEGATIVE
Ketones, UA: NEGATIVE
Leukocytes, UA: NEGATIVE
NITRITE UA: NEGATIVE
Protein, UA: NEGATIVE
Urobilinogen, UA: NEGATIVE
pH, UA: 5

## 2013-09-30 NOTE — Patient Instructions (Signed)

## 2013-09-30 NOTE — Progress Notes (Signed)
68 y.o. [id number] Divorced Asian Fe here for annual exam.   Menopausal no HRT. Denies vaginal bleeding or dryness. Saw Dr.Griffin for aex/labs all normal per patient. Patient scheduled for colonoscopy 12/15 per PCP. 10 th foot surgery for bunion and toe deformity. No other health issues today.  Patient's last menstrual period was 03/22/2003.          Sexually active: No.  The current method of family planning is tubal ligation.    Exercising: Yes.    at work Smoker:  no  Health Maintenance: Pap: 09-23-10 neg MMG: 09-27-12 bi-rads 1:neg, 2015 mammo done today Colonoscopy: 2006 BMD:   2015 with Dr. Soyla Murphy TDaP:  2010 Labs: Poct urine-neg Self breast exam: not done   reports that she has never smoked. She has never used smokeless tobacco. She reports that she does not drink alcohol or use illicit drugs.  Past Medical History  Diagnosis Date  . Hypertension   . Osteoporosis   . STD (sexually transmitted disease)     HSV    Past Surgical History  Procedure Laterality Date  . Foot surgery Right 2000, [id number]  . Foot surgery Left 2002, 2006, 2010  . Hammer toe repair Right 03/10/10    right foot # 5  . Base wedge osteomy Right 03/10/10    right foot  . Cotton ost w/graft Right 03/10/10    right foot  . Hammer toe repair Left 03/02/11    left foot 3 . 5  . Capsulotomy ipj Right 03/08/12    Right foot 2  . Bunionectomy Right 03/08/12    Keller right foot  . Tubal ligation    . Mandible fracture surgery      Current Outpatient Prescriptions  Medication Sig Dispense Refill  . calcium carbonate (OS-CAL) 600 MG TABS Take 600 mg by mouth daily.      . Calcium-Vitamin D-Vitamin K 527-782-42 MG-UNT-MCG CHEW Chew by mouth daily.      Marland Kitchen lisinopril (PRINIVIL,ZESTRIL) 10 MG tablet daily.      . Multiple Vitamin (MULTIVITAMIN) tablet Take 1 tablet by mouth daily.      . valACYclovir (VALTREX) 500 MG tablet as needed.      . Vitamin D, Ergocalciferol, (DRISDOL) 50000 UNITS CAPS Take  50,000 Units by mouth every 30 (thirty) days.       No current facility-administered medications for this visit.    Family History  Problem Relation Age of Onset  . Hypertension Sister   . Hypertension Brother   . Hypertension Mother     ROS:  Pertinent items are noted in HPI.  Otherwise, a comprehensive ROS was negative.  Exam:   BP 100/64  Pulse 80  Resp 16  Ht 5' 3.75" (1.619 m)  Wt 113 lb (51.256 kg)  BMI 19.55 kg/m2  LMP 03/22/2003 Height: 5' 3.75" (161.9 cm)  Ht Readings from Last 3 Encounters:  09/30/13 5' 3.75" (1.619 m)  09/10/13 5\' 4"  (1.626 m)  07/08/13 5\' 4"  (1.626 m)    General appearance: alert, cooperative and appears stated age Head: Normocephalic, without obvious abnormality, atraumatic Neck: no adenopathy, supple, symmetrical, trachea midline and thyroid normal to inspection and palpation and non-palpable Lungs: clear to auscultation bilaterally Breasts: normal appearance, no masses or tenderness, No nipple retraction or dimpling, No nipple discharge or bleeding, No axillary or supraclavicular adenopathy Heart: regular rate and rhythm Abdomen: soft, non-tender; no masses,  no organomegaly Extremities: extremities normal, atraumatic, no cyanosis or edema Skin: Skin color, texture, turgor  normal. No rashes or lesions Lymph nodes: Cervical, supraclavicular, and axillary nodes normal. No abnormal inguinal nodes palpated Neurologic: Grossly normal   Pelvic: External genitalia:  no lesions              Urethra:  normal appearing urethra with no masses, tenderness or lesions              Bartholin's and Skene's: normal                 Vagina: normal appearing vagina with normal color and discharge, no lesions              Cervix: normal, non tender. very tiny cervical polyp noted,no bleeding              Pap taken: Yes.   Bimanual Exam:  Uterus:  normal size, contour, position, consistency, mobility, non-tender and anteverted              Adnexa: normal  adnexa and no mass, fullness, tenderness               Rectovaginal: Confirms               Anus:  normal sphincter tone, no lesions  A:  Well Woman with normal exam  Menopausal no HRT  Osteoporosis with Dr. Soyla Murphy management, no Reclast this year, due to improved results  Colonoscopy due with PCP management  10 th foot surgery this year and hopefully lasts for bunion and toe deformity. Last one!  Tiny cervical polyp  P:   Reviewed health and wellness pertinent to exam  Aware of need to evaluate if vaginal bleeding  Continue follow up as indicated.  Pap smear taken today with HPVR  Reviewed findings with patient and discussed benign finding and no need to remove unless changes or bleeding occurs. Questions addressed at length.   counseled on breast self exam, mammography screening, adequate intake of calcium and vitamin D, diet and exercise  return annually or prn  An After Visit Summary was printed and given to the patient.

## 2013-09-30 NOTE — Progress Notes (Signed)
Reviewed personally.  M. Suzanne Ashaki Frosch, MD.  

## 2013-10-04 LAB — IPS PAP TEST WITH HPV

## 2013-10-07 NOTE — Addendum Note (Signed)
Addended by: Regina Eck on: 10/07/2013 07:17 AM   Modules accepted: Orders

## 2013-10-10 ENCOUNTER — Telehealth: Payer: Self-pay | Admitting: Emergency Medicine

## 2013-10-10 NOTE — Telephone Encounter (Signed)
Patient returning call to tracy

## 2013-10-10 NOTE — Telephone Encounter (Signed)
pre-cert complete/PR: $09.40//HWK

## 2013-10-10 NOTE — Telephone Encounter (Signed)
Message left to return call to Renee Watkins at [phone].    

## 2013-10-10 NOTE — Telephone Encounter (Signed)
Called pt but did not leave message. Will try again .

## 2013-10-10 NOTE — Telephone Encounter (Signed)
Message copied by Michele Mcalpine on Thu Oct 10, 2013 10:48 AM ------      Message from: Regina Eck      Created: Mon Oct 07, 2013  7:13 AM       Notify patient that pap smear was negative,but HPVHR was positive needs Colposcopy exam, order in ------

## 2013-10-11 NOTE — Telephone Encounter (Signed)
Patient notified of message from Regina Eck CNM.  She is agreeable to scheduling colposcopy. Brief description of procedure given to patient.  Colposcopy pre-procedure instructions given. Advised 800 mg of Motrin with food one hour prior to appointment. Motrin=Advil=Ibuprofen Can take 800 mg (Can purchase over the counter, you will need four 200 mg pills). Make sure to eat a meal before appointment and drink plenty of fluids. Patient verbalized understanding and will call to reschedule if needed. Advised will need to cancel within 24 hours or will have $100.00 no show fee placed to account.  Type of birth control: Menopausal   Routing to provider for final review. Patient agreeable to disposition. Will close encounter  Scheduled for 10/15/13 at 1400.

## 2013-10-11 NOTE — Telephone Encounter (Signed)
Message left to return call to Aariv Medlock at [phone].    

## 2013-10-15 ENCOUNTER — Ambulatory Visit (INDEPENDENT_AMBULATORY_CARE_PROVIDER_SITE_OTHER): Payer: 59 | Admitting: Certified Nurse Midwife

## 2013-10-15 ENCOUNTER — Encounter: Payer: Self-pay | Admitting: Certified Nurse Midwife

## 2013-10-15 VITALS — BP 120/70 | HR 70 | Resp 16 | Ht 63.75 in | Wt 113.0 lb

## 2013-10-15 DIAGNOSIS — R87810 Cervical high risk human papillomavirus (HPV) DNA test positive: Secondary | ICD-10-CM

## 2013-10-15 DIAGNOSIS — R87619 Unspecified abnormal cytological findings in specimens from cervix uteri: Secondary | ICD-10-CM

## 2013-10-15 NOTE — Progress Notes (Addendum)
Patient ID: Renee Watkins, female   DOB: 09/24/1945, 68 y.o.   MRN: [id number]  Chief Complaint  Patient presents with  . Colposcopy    HPI Renee Watkins is a 68 y.o. female.  Divorced g2p2002 here for colposcopy for +HPVHR. Denies vaginal bleeding or vaginal pain. Patient does have vaginal dryness, not sexually active.  HPI  Indications: Pap smear on 7/13 2015 showed: Positive HPVHR. Previous colposcopy: none. Prior cervical treatment: none.  Past Medical History  Diagnosis Date  . Hypertension   . Osteoporosis   . STD (sexually transmitted disease)     HSV  . Abnormal Pap smear of cervix 09-30-13    neg pap HPV HR +    Past Surgical History  Procedure Laterality Date  . Foot surgery Right 2000, [id number]  . Foot surgery Left 2002, 2006, 2010  . Hammer toe repair Right 03/10/10    right foot # 5  . Base wedge osteomy Right 03/10/10    right foot  . Cotton ost w/graft Right 03/10/10    right foot  . Hammer toe repair Left 03/02/11    left foot 3 . 5  . Capsulotomy ipj Right 03/08/12    Right foot 2  . Bunionectomy Right 03/08/12    Keller right foot  . Tubal ligation    . Mandible fracture surgery    . Colposcopy  10-15-13    Family History  Problem Relation Age of Onset  . Hypertension Sister   . Hypertension Brother   . Hypertension Mother     Social History History  Substance Use Topics  . Smoking status: Never Smoker   . Smokeless tobacco: Never Used  . Alcohol Use: No    Allergies  Allergen Reactions  . Penicillins Anaphylaxis    Current Outpatient Prescriptions  Medication Sig Dispense Refill  . calcium carbonate (OS-CAL) 600 MG TABS Take 600 mg by mouth daily.      . Calcium-Vitamin D-Vitamin K 502-774-12 MG-UNT-MCG CHEW Chew by mouth daily.      Marland Kitchen lisinopril (PRINIVIL,ZESTRIL) 10 MG tablet daily.      . Multiple Vitamin (MULTIVITAMIN) tablet Take 1 tablet by mouth daily.      . valACYclovir (VALTREX) 500 MG tablet as needed.      . Vitamin D,  Ergocalciferol, (DRISDOL) 50000 UNITS CAPS Take 50,000 Units by mouth every 30 (thirty) days.       No current facility-administered medications for this visit.    Review of Systems Review of Systems  Constitutional: Negative.   Genitourinary: Negative for vaginal bleeding, vaginal discharge and vaginal pain.    Blood pressure 120/70, pulse 70, resp. rate 16, height 5' 3.75" (1.619 m), weight 113 lb (51.256 kg), last menstrual period 03/22/2003.  Physical Exam Physical Exam  Constitutional: She is oriented to person, place, and time. She appears well-developed and well-nourished.  Genitourinary: Vagina normal. No vaginal discharge found.    Neurological: She is alert and oriented to person, place, and time.  Skin: Skin is warm and dry.  Psychiatric: She has a normal mood and affect. Her behavior is normal. Judgment normal.    Data Reviewed Reviewed pap smear with patients. Questions addressed.  Assessment    Procedure Details  The risks and benefits of the procedure and Written informed consent obtained.  Speculum placed in vagina and excellent visualization of cervix achieved, cervix swabbed x 3 with saline and acetic acid solution. Cervix viewed with 3.75,7.5,15# and green filter, no acetowhite area noted. Endocervical polyp  noted, as per previous exam. Lugol's applied with no abnormal non staining areas noted. Cervical speculum used no abnormal areas noted, with polyp noted. Dr. Charlies Constable verified no abnormal findings noted also. ECC obtained. Biopsy of polyp obtained by Dr Charlies Constable. Monsel's applied by D.L. No active bleeding noted on removal of speculum. Instructions given. Patient tolerated procedure well.  Specimens: 2  Complications: none.     Plan    Specimens labelled and sent to Pathology. Pathology will be reviewed and patient notified of results.   Pathology reviewed of Ecc benign endocervical glands, no malignancy or atypia. Cervical polyp benign, no atypia or  malignancy Patient will be notified of results and follow up with repeat pap smear in one year. Pap recall   [facility] 10/15/2013, 2:34 PM

## 2013-10-15 NOTE — Patient Instructions (Signed)

## 2013-10-15 NOTE — Progress Notes (Signed)
Pt had pap 09-30-13 neg HPV HR +.

## 2013-10-17 LAB — IPS OTHER TISSUE BIOPSY

## 2013-10-18 NOTE — Progress Notes (Signed)
Note reviewed, agree with plan.  El Pile, MD  

## 2013-10-24 ENCOUNTER — Telehealth: Payer: Self-pay

## 2013-10-24 NOTE — Telephone Encounter (Signed)
Patient returning call during lunch

## 2013-10-24 NOTE — Telephone Encounter (Signed)
Message copied by Jasmine Awe on Thu Oct 24, 2013 10:17 AM ------      Message from: Regina Eck      Created: Wed Oct 23, 2013 10:47 PM       Notify patient that colposcopy pathology showed benign appearing endocervical glands, negative for atypia or malignancy needs repeat pap in one year 08  Pap recall      Polyp was benign ------

## 2013-10-24 NOTE — Telephone Encounter (Signed)
Left message to call Ileene Allie at [phone]. 

## 2013-10-24 NOTE — Telephone Encounter (Signed)
Left message to call Prisca Gearing at [phone]. 

## 2013-10-24 NOTE — Telephone Encounter (Signed)
Spoke with patient. Advised of results as seen below. Patient is agreeable and verbalizes understanding. 08 recall entered. aex 10/06/14.  Routing to provider for final review. Patient agreeable to disposition. Will close encounter

## 2014-01-10 ENCOUNTER — Ambulatory Visit: Payer: 59

## 2014-01-10 DIAGNOSIS — M2020 Hallux rigidus, unspecified foot: Secondary | ICD-10-CM

## 2014-01-10 DIAGNOSIS — M2021 Hallux rigidus, right foot: Secondary | ICD-10-CM

## 2014-01-20 ENCOUNTER — Encounter: Payer: Self-pay | Admitting: Certified Nurse Midwife

## 2014-01-27 ENCOUNTER — Ambulatory Visit: Payer: 59

## 2014-03-07 DIAGNOSIS — M2010 Hallux valgus (acquired), unspecified foot: Secondary | ICD-10-CM

## 2014-04-15 ENCOUNTER — Ambulatory Visit (INDEPENDENT_AMBULATORY_CARE_PROVIDER_SITE_OTHER): Payer: 59

## 2014-04-15 VITALS — BP 122/86 | HR 72 | Resp 12

## 2014-04-15 DIAGNOSIS — M19071 Primary osteoarthritis, right ankle and foot: Secondary | ICD-10-CM

## 2014-04-15 DIAGNOSIS — M2021 Hallux rigidus, right foot: Secondary | ICD-10-CM

## 2014-04-15 NOTE — Progress Notes (Signed)
   Subjective:    Patient ID: Renee Watkins, female    DOB: 03-28-1945, 69 y.o.   MRN: [id number]  HPI ''LT FOOT GREAT TOE STILL LITTLE SORE.''   DISPENSED DIABETIC SHOES WITHOUT INSERTS.   Review of Systems no new findings or systemic changes noted     Objective:   Physical Exam Neurovascular status otherwise intact patient's had a first MTP fusion right foot otherwise doing well digits are stable does have plantarflexed metatarsal with atrophy of plantar fat pad has some what she thinks Danie Diehl on her toe however patient does have some arthropathy at the IP joint with some bony prominence this is not a corn cannot be trimmed away. At this time patient is dispensed extra-depth shoes with Plastizote inlays which fit and contour well to the foot patient will maintain these to accommodate her foot and deformity and help her facilitate activities.       Assessment & Plan:  Are assessment good postop progress on first MTP fusion patient does have severe arthropathy with hallux rigidus appropriate accommodative shoes dispensed at this time per patient request follow-up possibly annually for checkup and reevaluation as needed next  Harriet Masson DPM

## 2014-04-24 ENCOUNTER — Other Ambulatory Visit: Payer: Self-pay | Admitting: Gastroenterology

## 2014-06-27 ENCOUNTER — Encounter (HOSPITAL_COMMUNITY): Payer: Self-pay | Admitting: *Deleted

## 2014-07-02 ENCOUNTER — Telehealth: Payer: Self-pay | Admitting: Certified Nurse Midwife

## 2014-07-02 NOTE — Telephone Encounter (Signed)
Left message regarding upcoming appointment has been canceled and needs to be rescheduled. °

## 2014-07-08 ENCOUNTER — Ambulatory Visit (HOSPITAL_COMMUNITY)
Admission: RE | Admit: 2014-07-08 | Discharge: 2014-07-08 | Disposition: A | Discharge status: Home / Self Care | Payer: 59 | Source: Ambulatory Visit | Attending: Gastroenterology | Admitting: Gastroenterology

## 2014-07-08 ENCOUNTER — Encounter (HOSPITAL_COMMUNITY)
Admission: RE | Disposition: A | Discharge status: Home / Self Care | Payer: Self-pay | Source: Ambulatory Visit | Attending: Gastroenterology

## 2014-07-08 ENCOUNTER — Ambulatory Visit (HOSPITAL_COMMUNITY): Payer: 59 | Admitting: Anesthesiology

## 2014-07-08 ENCOUNTER — Encounter (HOSPITAL_COMMUNITY): Payer: Self-pay | Admitting: Anesthesiology

## 2014-07-08 DIAGNOSIS — Z1211 Encounter for screening for malignant neoplasm of colon: Secondary | ICD-10-CM | POA: Insufficient documentation

## 2014-07-08 DIAGNOSIS — I10 Essential (primary) hypertension: Secondary | ICD-10-CM | POA: Insufficient documentation

## 2014-07-08 HISTORY — PX: COLONOSCOPY WITH PROPOFOL: SHX5780

## 2014-07-08 SURGERY — COLONOSCOPY WITH PROPOFOL
Anesthesia: Monitor Anesthesia Care

## 2014-07-08 MED ORDER — PROPOFOL 10 MG/ML IV BOLUS
INTRAVENOUS | Status: AC
  Filled 2014-07-08: qty 20

## 2014-07-08 MED ORDER — PROPOFOL 10 MG/ML IV BOLUS
INTRAVENOUS | Status: DC | PRN
  Administered 2014-07-08: 10 mg via INTRAVENOUS
  Administered 2014-07-08: 40 mg via INTRAVENOUS
  Administered 2014-07-08 (×2): 20 mg via INTRAVENOUS
  Administered 2014-07-08: 10 mg via INTRAVENOUS
  Administered 2014-07-08: 20 mg via INTRAVENOUS

## 2014-07-08 MED ORDER — PROPOFOL INFUSION 10 MG/ML OPTIME
INTRAVENOUS | Status: DC | PRN
  Administered 2014-07-08: 100 ug/kg/min via INTRAVENOUS

## 2014-07-08 MED ORDER — LACTATED RINGERS IV SOLN
INTRAVENOUS | Status: DC
  Administered 2014-07-08: 1000 mL via INTRAVENOUS

## 2014-07-08 SURGICAL SUPPLY — 22 items

## 2014-07-08 NOTE — Op Note (Signed)
Procedure: Screening colonoscopy. Normal screening colonoscopy performed on 03/10/2004  Endoscopist: Earle Gell  Premedication: Propofol administered by anesthesia  Procedure: The patient was placed in the left lateral decubitus position. Anal inspection and digital rectal exam were normal. The Pentax pediatric colonoscope was introduced into the rectum and advanced to the cecum. A normal-appearing ileocecal valve and appendiceal orifice were identified. Colonic preparation for the exam today was good. Withdrawal time was 6 minutes. Performance of today's colonoscopy was technically very difficult due to left colonic loop formation.  Rectum. Normal. Retroflexed view of the distal rectum was normal  Sigmoid colon and descending colon. Normal  Splenic flexure. Normal  Transverse colon. Normal  Hepatic flexure. Normal  Ascending colon. Normal  Cecum and ileocecal valve. Normal  Assessment:  #1. Normal screening colonoscopy  #2. Technically, a very difficult colonoscopy to perform due to to left colonic loop formation. Observe for signs of splenic injury post colonoscopy

## 2014-07-08 NOTE — H&P (Signed)
  Procedure: Screening colonoscopy. Normal screening colonoscopy performed on 03/10/2004  History: The patient is a 69 year old female born 09-01-1945. She is scheduled to undergo a repeat screening colonoscopy today.  Medication allergies: Penicillin. Fosamax caused osteonecrosis of the jaw  Past medical history: Hypertension. Osteoporosis. Multiple foot surgeries.  Exam: The patient is alert and lying comfortably on the endoscopy stretcher. Abdomen is soft and nontender to palpation. Lungs are clear to auscultation. Cardiac exam reveals a regular rhythm.  Plan: Proceed with screening colonoscopy

## 2014-07-08 NOTE — Anesthesia Postprocedure Evaluation (Signed)
  Anesthesia Post-op Note  Patient: Renee Watkins  Procedure(s) Performed: Procedure(s) (LRB): COLONOSCOPY WITH PROPOFOL (N/A)  Patient Location: PACU  Anesthesia Type: MAC  Level of Consciousness: awake and alert   Airway and Oxygen Therapy: Patient Spontanous Breathing  Post-op Pain: mild  Post-op Assessment: Post-op Vital signs reviewed, Patient's Cardiovascular Status Stable, Respiratory Function Stable, Patent Airway and No signs of Nausea or vomiting  Last Vitals:  Filed Vitals:   07/08/14 1220  BP: 181/99  Pulse:   Temp:   Resp: 12    Post-op Vital Signs: stable   Complications: No apparent anesthesia complications

## 2014-07-08 NOTE — Anesthesia Preprocedure Evaluation (Signed)
Anesthesia Evaluation  Patient identified by MRN, date of birth, ID band Patient awake    Reviewed: Allergy & Precautions, NPO status , Patient's Chart, lab work & pertinent test results  Airway Mallampati: II  TM Distance: >3 FB Neck ROM: Full    Dental no notable dental hx.    Pulmonary neg pulmonary ROS,  breath sounds clear to auscultation  Pulmonary exam normal       Cardiovascular hypertension, Pt. on medications Rhythm:Regular Rate:Normal     Neuro/Psych negative neurological ROS  negative psych ROS   GI/Hepatic negative GI ROS, Neg liver ROS,   Endo/Other  negative endocrine ROS  Renal/GU negative Renal ROS  negative genitourinary   Musculoskeletal negative musculoskeletal ROS (+)   Abdominal   Peds negative pediatric ROS (+)  Hematology negative hematology ROS (+)   Anesthesia Other Findings   Reproductive/Obstetrics negative OB ROS                             Anesthesia Physical Anesthesia Plan  ASA: II  Anesthesia Plan: MAC   Post-op Pain Management:    Induction:   Airway Management Planned: Simple Face Mask  Additional Equipment:   Intra-op Plan:   Post-operative Plan:   Informed Consent: I have reviewed the patients History and Physical, chart, labs and discussed the procedure including the risks, benefits and alternatives for the proposed anesthesia with the patient or authorized representative who has indicated his/her understanding and acceptance.   Dental advisory given  Plan Discussed with: CRNA  Anesthesia Plan Comments:         Anesthesia Quick Evaluation

## 2014-07-08 NOTE — Transfer of Care (Signed)
Immediate Anesthesia Transfer of Care Note  Patient: Renee Watkins  Procedure(s) Performed: Procedure(s): COLONOSCOPY WITH PROPOFOL (N/A)  Patient Location: PACU  Anesthesia Type:MAC  Level of Consciousness:  sedated, patient cooperative and responds to stimulation  Airway & Oxygen Therapy:Patient Spontanous Breathing and Patient connected to face mask oxgen  Post-op Assessment:  Report given to PACU RN and Post -op Vital signs reviewed and stable  Post vital signs:  Reviewed and stable  Last Vitals:  Filed Vitals:   07/08/14 1042  BP: 164/85  Pulse: 77  Temp: 36.7 C  Resp: 15    Complications: No apparent anesthesia complications

## 2014-07-09 ENCOUNTER — Encounter (HOSPITAL_COMMUNITY): Payer: Self-pay | Admitting: Gastroenterology

## 2014-10-06 ENCOUNTER — Ambulatory Visit: Payer: 59 | Admitting: Certified Nurse Midwife

## 2014-10-08 ENCOUNTER — Ambulatory Visit: Payer: 59 | Admitting: Certified Nurse Midwife

## 2014-10-10 ENCOUNTER — Ambulatory Visit: Payer: 59 | Admitting: Certified Nurse Midwife

## 2014-10-29 ENCOUNTER — Encounter: Payer: Self-pay | Admitting: Certified Nurse Midwife

## 2014-10-29 ENCOUNTER — Ambulatory Visit (INDEPENDENT_AMBULATORY_CARE_PROVIDER_SITE_OTHER): Payer: 59 | Admitting: Certified Nurse Midwife

## 2014-10-29 VITALS — BP 104/64 | HR 68 | Resp 16 | Ht 64.0 in | Wt 110.0 lb

## 2014-10-29 DIAGNOSIS — Z124 Encounter for screening for malignant neoplasm of cervix: Secondary | ICD-10-CM | POA: Diagnosis not present

## 2014-10-29 DIAGNOSIS — Z01419 Encounter for gynecological examination (general) (routine) without abnormal findings: Secondary | ICD-10-CM

## 2014-10-29 DIAGNOSIS — Z0000 Encounter for general adult medical examination without abnormal findings: Secondary | ICD-10-CM | POA: Diagnosis not present

## 2014-10-29 LAB — POCT URINALYSIS DIPSTICK
Bilirubin, UA: NEGATIVE
Blood, UA: NEGATIVE
Glucose, UA: NEGATIVE
Ketones, UA: NEGATIVE
LEUKOCYTES UA: NEGATIVE
NITRITE UA: NEGATIVE
Protein, UA: NEGATIVE
UROBILINOGEN UA: NEGATIVE
pH, UA: 5

## 2014-10-29 NOTE — Patient Instructions (Signed)

## 2014-10-29 NOTE — Progress Notes (Signed)
Reviewed personally.  M. Suzanne Calob Baskette, MD.  

## 2014-10-29 NOTE — Progress Notes (Signed)
69 y.o. G2P2002 Divorced  Asian Fe here for annual exam.  Denies vaginal bleeding or vaginal dryness. Sees PCP Lavone Orn PCP for aex and labs/hypertension management. Sees Dr.Balan for Osteoporosis management with Reclast use. Recent eye exam with early cataracts noted. Still working. Eating well , little chicken, but mainly vegetables and breads. Drinks water and tea. No bladder or bowel issues. No health concerns today.  Patient's last menstrual period was 03/22/2003.          Sexually active: No.  The current method of family planning is tubal ligation.    Exercising: No.  exercise Smoker:  no  Health Maintenance: Pap: 09-30-13 neg HPV HR +, colpo done 10-15-13 benign appearing endocervical glands MMG:  09-30-13 category b density,birads 1:neg, pt states she had mammo 10-20-14 Colonoscopy:  2016 BMD:   2015 osteoporosis Dr. Suzette Battiest manages with Reclast this year TDaP:  2010 Labs: poct urine-neg Self breast exam:monthly   reports that she has never smoked. She has never used smokeless tobacco. She reports that she does not drink alcohol or use illicit drugs.  Past Medical History  Diagnosis Date  . Hypertension   . Osteoporosis   . STD (sexually transmitted disease)     HSV  . Abnormal Pap smear of cervix 09-30-13    neg pap HPV HR +    Past Surgical History  Procedure Laterality Date  . Foot surgery Right 2000, [id number]  . Foot surgery Left 2002, 2006, 2010  . Hammer toe repair Right 03/10/10    right foot # 5  . Base wedge osteomy Right 03/10/10    right foot  . Cotton ost w/graft Right 03/10/10    right foot  . Hammer toe repair Left 03/02/11    left foot 3 . 5  . Capsulotomy ipj Right 03/08/12    Right foot 2  . Bunionectomy Right 03/08/12    Keller right foot  . Tubal ligation    . Mandible fracture surgery    . Colposcopy  10-15-13  . Colonoscopy with propofol N/A 07/08/2014    Procedure: COLONOSCOPY WITH PROPOFOL;  Surgeon: Garlan Fair, MD;  Location: [facility]  ENDOSCOPY;  Service: Endoscopy;  Laterality: N/A;    Current Outpatient Prescriptions  Medication Sig Dispense Refill  . calcium carbonate (OS-CAL) 600 MG TABS Take 600 mg by mouth daily.    Marland Kitchen lisinopril (PRINIVIL,ZESTRIL) 10 MG tablet Take 10 mg by mouth every morning.     . Multiple Vitamin (MULTIVITAMIN) tablet Take 1 tablet by mouth daily.    . Multiple Vitamins-Minerals (ICAPS PO) Take 1 capsule by mouth daily.    . valACYclovir (VALTREX) 500 MG tablet Take 500 mg by mouth daily as needed (for outbreaks).     . Vitamin D, Ergocalciferol, (DRISDOL) 50000 UNITS CAPS Take 50,000 Units by mouth every 30 (thirty) days.     No current facility-administered medications for this visit.    Family History  Problem Relation Age of Onset  . Hypertension Sister   . Hypertension Brother   . Hypertension Mother     ROS:  Pertinent items are noted in HPI.  Otherwise, a comprehensive ROS was negative.  Exam:   Ht 5\' 4"  (1.626 m)  Wt 110 lb (49.896 kg)  BMI 18.87 kg/m2  LMP 03/22/2003 Height: 5\' 4"  (162.6 cm) Ht Readings from Last 3 Encounters:  10/29/14 5\' 4"  (1.626 m)  07/08/14 5\' 4"  (1.626 m)  10/15/13 5' 3.75" (1.619 m)    General appearance: alert,  cooperative and appears stated age Head: Normocephalic, without obvious abnormality, atraumatic Neck: no adenopathy, supple, symmetrical, trachea midline and thyroid normal to inspection and palpation Lungs: clear to auscultation bilaterally Breasts: normal appearance, no masses or tenderness, No nipple retraction or dimpling, No nipple discharge or bleeding, No axillary or supraclavicular adenopathy Heart: regular rate and rhythm Abdomen: soft, non-tender; no masses,  no organomegaly Extremities: extremities normal, atraumatic, no cyanosis or edema Skin: Skin color, texture, turgor normal. No rashes or lesions Lymph nodes: Cervical, supraclavicular, and axillary nodes normal. No abnormal inguinal nodes palpated Neurologic: Grossly  normal   Pelvic: External genitalia:  no lesions              Urethra:  normal appearing urethra with no masses, tenderness or lesions              Bartholin's and Skene's: normal                 Vagina: normal appearing vagina with normal color and discharge, no lesions              Cervix: very small appearance, non tender, no lesions              Pap taken: Yes.   Bimanual Exam:  Uterus:  normal size, contour, position, consistency, mobility, non-tender              Adnexa: normal adnexa and no mass, fullness, tenderness               Rectovaginal: Confirms               Anus:  normal sphincter tone, no lesions  Chaperone present: Yes  A:  Well Woman with normal exam  Menopausal no HRT  Atrophic vaginitis  History of pap smear negative with + HPVHR, colpo negative ECC, follow up pap today  Osteoporosis with Endocrine management  Hypertension management with PCP management  Weight loss 3 pounds in one year, normal nutritional profile in discussion  P:   Reviewed health and wellness pertinent to exam  Discussed importance of notifying if vaginal bleeding.  Discussed vaginal dryness and use of Coconut oil vaginally for dryness. Instructed to use daily. Voiced understanding of use.  Pap smear as above with HPVHR if normal repeat one year, if not per results  Continue follow up with MD as recommended.  Discussed daily diet needs and encouraged to increase portion size to maintain weight. Encouraged to have daughters help with food needs.  counseled on breast self exam, mammography screening, STD prevention, HIV risk factors and prevention, adequate intake of calcium and vitamin D, diet and exercise  return annually or prn  An After Visit Summary was printed and given to the patient.

## 2014-11-03 LAB — IPS PAP TEST WITH HPV

## 2014-11-07 ENCOUNTER — Other Ambulatory Visit: Payer: Self-pay | Admitting: Certified Nurse Midwife

## 2014-11-07 DIAGNOSIS — R87610 Atypical squamous cells of undetermined significance on cytologic smear of cervix (ASC-US): Secondary | ICD-10-CM

## 2014-11-10 ENCOUNTER — Telehealth: Payer: Self-pay | Admitting: Emergency Medicine

## 2014-11-10 NOTE — Telephone Encounter (Signed)
Message left to return call to Renee Watkins at [phone].    

## 2014-11-10 NOTE — Telephone Encounter (Signed)
-----   Message from Regina Eck, CNM sent at 11/07/2014  8:07 AM EDT ----- Notify patient that pap smear is abnormal again with ASCUS +HPVHR will need repeat colposcopy. Would like for patient to [place] Coconut oil vaginally, nightly for 2 weeks prior to colposcopy. Order in please schedule with DL

## 2014-11-13 NOTE — Telephone Encounter (Signed)
Patient returned my call. Reviewed benefits and patient is agreeable.   Routing to triage to schedule.

## 2014-11-13 NOTE — Telephone Encounter (Signed)
Call to patient to discuss benefits for procedure. Left message on machine to return my call.

## 2014-11-13 NOTE — Telephone Encounter (Signed)
Message left to return call to Macgregor Aeschliman at [phone].    

## 2014-11-13 NOTE — Telephone Encounter (Signed)
Spoke with patient and message from Regina Eck CNM given.   Patient given results of atypical squamous cells of undetermined significance pap smear results and high risk HPV results.   Agreeable to colposcopy, scheduled for 11/19/14 at 1400.  Day per patient request. She confirms she has been using coconut oil vaginally each night since office visit. Advised to continue using as she has been.  Motrin 800 mg po one hour before with food and fluids. Patient verbalized understanding.  Routing to provider for final review. Patient agreeable to disposition. Will close encounter.          Marland Kitchen

## 2014-11-19 ENCOUNTER — Encounter: Payer: Self-pay | Admitting: Certified Nurse Midwife

## 2014-11-19 ENCOUNTER — Ambulatory Visit (INDEPENDENT_AMBULATORY_CARE_PROVIDER_SITE_OTHER): Payer: 59 | Admitting: Certified Nurse Midwife

## 2014-11-19 DIAGNOSIS — R87610 Atypical squamous cells of undetermined significance on cytologic smear of cervix (ASC-US): Secondary | ICD-10-CM

## 2014-11-19 NOTE — Progress Notes (Signed)
09-30-13 neg pap HPV HR + colpo 10-15-13 ECC neg, polyp neg 10-29-14 ASCUS HPV HR +

## 2014-11-19 NOTE — Patient Instructions (Signed)

## 2014-11-19 NOTE — Progress Notes (Signed)
Reviewed personally.  M. Suzanne Kamie Korber, MD.  

## 2014-11-19 NOTE — Progress Notes (Addendum)
Patient ID: Renee Watkins, female   DOB: [birth year], 69 y.o.   MRN: [id number]  Chief Complaint  Patient presents with  . Colposcopy    HPI Renee Watkins is a 69 y.o. asian g2 p2002 divorced   female here for colposcopy exam. Menopausal with vaginal dryness using coconut oil with good response. Denies vaginal pain,discharge or bleeding or pelvic pain.  HPI  Indications: Pap smear on 8/10 2016 showed: ASCUS with POSITIVE high risk HPV. Previous colposcopy: in 10/15/13 ECC showed benign appearing endocervical glands, negative for atypia or malignancy.. Prior cervical treatment: no treatment and expectant management.  Past Medical History  Diagnosis Date  . Hypertension   . Osteoporosis   . STD (sexually transmitted disease)     HSV  . Abnormal Pap smear of cervix 09-30-13    neg pap HPV HR +, 8-10-16ASCUS HPV HR+    Past Surgical History  Procedure Laterality Date  . Foot surgery Right 2000, [id number]  . Foot surgery Left 2002, 2006, 2010  . Hammer toe repair Right 03/10/10    right foot # 5  . Base wedge osteomy Right 03/10/10    right foot  . Cotton ost w/graft Right 03/10/10    right foot  . Hammer toe repair Left 03/02/11    left foot 3 . 5  . Capsulotomy ipj Right 03/08/12    Right foot 2  . Bunionectomy Right 03/08/12    Keller right foot  . Tubal ligation    . Mandible fracture surgery    . Colonoscopy with propofol N/A 07/08/2014    Procedure: COLONOSCOPY WITH PROPOFOL;  Surgeon: Garlan Fair, MD;  Location: [facility] ENDOSCOPY;  Service: Endoscopy;  Laterality: N/A;  . Colposcopy  10-15-13    ECC neg, polyp neg    Family History  Problem Relation Age of Onset  . Hypertension Sister   . Hypertension Brother   . Hypertension Mother     Social History Social History  Substance Use Topics  . Smoking status: Never Smoker   . Smokeless tobacco: Never Used  . Alcohol Use: No    Allergies  Allergen Reactions  . Penicillins Anaphylaxis    Current Outpatient  Prescriptions  Medication Sig Dispense Refill  . CALCIUM PO Take by mouth daily.    Marland Kitchen lisinopril (PRINIVIL,ZESTRIL) 10 MG tablet Take 10 mg by mouth every morning.     . Multiple Vitamin (MULTIVITAMIN) tablet Take 1 tablet by mouth daily.    . valACYclovir (VALTREX) 500 MG tablet Take 500 mg by mouth daily as needed (for outbreaks).     . Vitamin D, Ergocalciferol, (DRISDOL) 50000 UNITS CAPS Take 50,000 Units by mouth every 30 (thirty) days.     No current facility-administered medications for this visit.    Review of Systems Review of Systems  Constitutional: Negative.   Genitourinary: Negative for vaginal bleeding, vaginal discharge and vaginal pain.    Blood pressure 116/62, pulse 68, resp. rate 16, height 5\' 4"  (1.626 m), weight 111 lb (50.349 kg), last menstrual period 03/22/2003.  Physical Exam Physical Exam  Constitutional: She is oriented to person, place, and time. She appears well-developed and well-nourished.  HENT:  Mouth/Throat: No oropharyngeal exudate.  Genitourinary:    Neurological: She is alert and oriented to person, place, and time.  Skin: Skin is warm and dry.  Psychiatric: She has a normal mood and affect. Her behavior is normal. Judgment and thought content normal.    Data Reviewed Reviewed pap smear findings and addressed  questions regarding HPVHR.  Assessment \ ASCUS pap with HPVHR here for colposcopy   Procedure Details  The risks and benefits of the procedure and Written informed consent obtained. Reviewed with patient personally.  Speculum placed in vagina and excellent visualization of cervix achieved, cervix swabbed x 3 with saline and  acetic acid solution. Acetowhite area noted at 4-5 o'clock, Lugol's solution applied and non staining noted in same area. Biopsy taken at 4-5 o'clock of cervix after viewing with 3.75,7.5,15 #,green filter. ECC taken. Monsel's applied. No active bleeding noted upon removal of speculum. Patient tolerated procedure  well. Instructions given and voiced understanding. Written instructions also given.  Specimens: 2  Complications: none.     Plan    Specimens labelled and sent to Pathology. Patient will be notified of results once reviewed.  Pathology showed on cervical biopsy squamous mucosa with HPV features and atrophy, negative for high grade dysplasia. ECC shows benign atrophic squamous mucosa. Correlate well with pap smear. Patient to be notified of results and need for pap in one year. Pap recall      [facility] 11/19/2014, 2:10 PM

## 2014-11-21 ENCOUNTER — Telehealth: Payer: Self-pay

## 2014-11-21 LAB — IPS OTHER TISSUE BIOPSY

## 2014-11-21 NOTE — Telephone Encounter (Signed)
-----   Message from Regina Eck, CNM sent at 11/21/2014 12:00 PM EDT ----- Notify patient that cervical biopsy shows squamous mucosa with features consistent with HPV effect with atrophy, negative for high grade dysplasia Ecc shows benign atrophic squamous mucosa Results correlate well with pap smear results Repeat pap one year  Pap recall 08

## 2014-11-21 NOTE — Telephone Encounter (Signed)
Spoke with patient. Advised of results as seen below from [place]. Patient is agreeable and verbalizes understanding. Asking if she may continue to use coconut oil for vaginal dryness. Advised may continue to use this as needed. Patient is agreeable. Aex scheduled for 11/03/2015 at 1:15 pm with Regina Eck CNM. 08 recall entered.  Routing to provider for final review. Patient agreeable to disposition. Will close encounter.

## 2015-11-03 ENCOUNTER — Ambulatory Visit (INDEPENDENT_AMBULATORY_CARE_PROVIDER_SITE_OTHER): Payer: 59 | Admitting: Certified Nurse Midwife

## 2015-11-03 ENCOUNTER — Encounter: Payer: Self-pay | Admitting: Certified Nurse Midwife

## 2015-11-03 VITALS — BP 110/70 | HR 68 | Resp 16 | Ht 62.75 in | Wt 111.0 lb

## 2015-11-03 DIAGNOSIS — Z124 Encounter for screening for malignant neoplasm of cervix: Secondary | ICD-10-CM | POA: Diagnosis not present

## 2015-11-03 DIAGNOSIS — Z01419 Encounter for gynecological examination (general) (routine) without abnormal findings: Secondary | ICD-10-CM

## 2015-11-03 NOTE — Progress Notes (Signed)
70 y.o. [id number] Divorced  Asian Fe here for annual exam. Menopausal no HRT. Denies vaginal bleeding or vaginal dryness. Sees Dr. Laurann Montana for aex was 2 weeks ago with labs and hypertension management. All normal. Had cataract surgery on both eyes this year. Seeing much better. Not sexually active. Sees Dr. Chalmers Cater for Osteoporosis management sees her yearly, on Prolia every 6  Months now. Still working and doing well with driving. No other health issues today.  Patient's last menstrual period was 03/22/2003.          Sexually active: No.  The current method of family planning is tubal ligation.    Exercising: Yes.    walking Smoker:  no  Health Maintenance: Pap:  10-29-14 ASCUS HPV HR +, colpo squamous mucosa with features consistent with HPV effect with atrophy, negative for high grade dysplasia Ecc shows benign atrophic squamous mucosa MMG: 8/17 neg per patient Colonoscopy:  2016  BMD:   2015 osteoporosis TDaP:  2010 Shingles: had done Pneumonia: no Hep C and HIV: not done Labs: pcp Self breast exam: not done   reports that she has never smoked. She has never used smokeless tobacco. She reports that she does not drink alcohol or use drugs.  Past Medical History:  Diagnosis Date  . Abnormal Pap smear of cervix 09-30-13   neg pap HPV HR +, 8-10-16ASCUS HPV HR+  . Bilateral artificial lens implant   . Hypertension   . Osteoporosis   . STD (sexually transmitted disease)    HSV    Past Surgical History:  Procedure Laterality Date  . Base Wedge Osteomy Right 03/10/10   right foot  . BUNIONECTOMY Right 03/08/12   Keller right foot  . Capsulotomy IPJ Right 03/08/12   Right foot 2  . CATARACT EXTRACTION     both eyes  . COLONOSCOPY WITH PROPOFOL N/A 07/08/2014   Procedure: COLONOSCOPY WITH PROPOFOL;  Surgeon: Garlan Fair, MD;  Location: [facility];  Service: Endoscopy;  Laterality: N/A;  . COLPOSCOPY  10-15-13   ECC neg, polyp neg  . Thedora Hinders w/graft Right 03/10/10   right  foot  . FOOT SURGERY Right 2000, [id number]  . FOOT SURGERY Left 2002, 2006, 2010  . Hammer toe repair Right 03/10/10   right foot # 5  . Hammer toe repair Left 03/02/11   left foot 3 . 5  . MANDIBLE FRACTURE SURGERY    . TUBAL LIGATION      Current Outpatient Prescriptions  Medication Sig Dispense Refill  . CALCIUM PO Take by mouth daily.    Marland Kitchen lisinopril (PRINIVIL,ZESTRIL) 10 MG tablet Take 10 mg by mouth every morning.     . Multiple Vitamin (MULTIVITAMIN) tablet Take 1 tablet by mouth daily.    . Vitamin D, Ergocalciferol, (DRISDOL) 50000 UNITS CAPS Take 50,000 Units by mouth every 30 (thirty) days.     No current facility-administered medications for this visit.     Family History  Problem Relation Age of Onset  . Hypertension Sister   . Hypertension Brother   . Hypertension Mother     ROS:  Pertinent items are noted in HPI.  Otherwise, a comprehensive ROS was negative.  Exam:   BP 110/70   Pulse 68   Resp 16   Ht 5' 2.75" (1.594 m)   Wt 111 lb (50.3 kg)   LMP 03/22/2003   BMI 19.82 kg/m  Height: 5' 2.75" (159.4 cm) Ht Readings from Last 3 Encounters:  11/03/15 5' 2.75" (1.594  m)  11/19/14 5\' 4"  (1.626 m)  10/29/14 5\' 4"  (1.626 m)    General appearance: alert, cooperative and appears stated age Head: Normocephalic, without obvious abnormality, atraumatic Neck: no adenopathy, supple, symmetrical, trachea midline and thyroid normal to inspection and palpation Lungs: clear to auscultation bilaterally Breasts: normal appearance, no masses or tenderness, No nipple retraction or dimpling, No nipple discharge or bleeding, No axillary or supraclavicular adenopathy Heart: regular rate and rhythm Abdomen: soft, non-tender; no masses,  no organomegaly Extremities: extremities normal, atraumatic, no cyanosis or edema Skin: Skin color, texture, turgor normal. No rashes or lesions Lymph nodes: Cervical, supraclavicular, and axillary nodes normal. No abnormal inguinal nodes  palpated Neurologic: Grossly normal   Pelvic: External genitalia:  no lesions              Urethra:  normal appearing urethra with no masses, tenderness or lesions              Bartholin's and Skene's: normal                 Vagina: normal appearing vagina with normal color and discharge, no lesions              Cervix: very small appearance, difficult to see, but pap obtained, scant blood with pap              Pap taken: Yes.   Bimanual Exam:  Uterus:  normal size, contour, position, consistency, mobility, non-tender and mid postion              Adnexa: normal adnexa and no mass, fullness, tenderness               Rectovaginal: Confirms               Anus:  normal sphincter tone, no lesions  Chaperone present: yes  A:  Well Woman with normal exam  Menopausal no HRT  Atrophic vaginitis uses coconut oil occasionally  Previous pap ASCUS + HPVHR , colpo ECC is negative follow up pap today  Osteoporosis management with Endocrine on Prolia  Hypertension management with PCP  P:   Reviewed health and wellness pertinent to exam  Aware of need to evaluate if vaginal bleeding  Discussed need to increase coconut oil use vaginally at least 3 times weekly.  Continue follow up with MD as indicated  Pap smear as above with HPV reflex, if negative repeat one year if not per results   counseled on breast self exam, mammography screening, adequate intake of calcium and vitamin D, diet and exercise  return annually or prn  An After Visit Summary was printed and given to the patient.

## 2015-11-03 NOTE — Patient Instructions (Signed)

## 2015-11-05 LAB — IPS PAP TEST WITH REFLEX TO HPV

## 2015-11-07 NOTE — Progress Notes (Signed)
Encounter reviewed Ayvah Caroll, MD   

## 2015-11-10 LAB — IPS HPV ON A LIQUID BASED SPECIMEN

## 2015-11-13 ENCOUNTER — Telehealth: Payer: Self-pay | Admitting: *Deleted

## 2015-11-13 ENCOUNTER — Other Ambulatory Visit: Payer: Self-pay | Admitting: Certified Nurse Midwife

## 2015-11-13 DIAGNOSIS — IMO0002 Reserved for concepts with insufficient information to code with codable children: Secondary | ICD-10-CM

## 2015-11-13 NOTE — Telephone Encounter (Signed)
Call to patient, left message to call back and ask for triage nurse.  

## 2015-11-13 NOTE — Telephone Encounter (Signed)
-----   Message from Regina Eck, CNM sent at 11/13/2015  7:49 AM EDT ----- Notify patient that pap smear is negative, but HPVHR is positive. Will need another colpo as follow up from previous abnormal and colpo findings. Please schedule Order placed

## 2015-11-16 NOTE — Telephone Encounter (Signed)
Spoke with patient. Advised of message and results as seen below from [place]. She is agreeable and verbalizes understanding. Colposcopy appointment scheduled for 12/02/2015 at 2 pm with Melvia Heaps CNM. She is agreeable to date and time.  Instructions given. Motrin 800 mg po x , one hour before appointment with food. Make sure to eat a meal before appointment and drink plenty of fluids. Patient agreeable and verbalized understanding of all instructions. Order previously placed by Melvia Heaps CNM for precert.  Routing to provider for final review. Patient agreeable to disposition. Will close encounter.

## 2015-11-18 ENCOUNTER — Telehealth: Payer: Self-pay | Admitting: Certified Nurse Midwife

## 2015-11-18 NOTE — Telephone Encounter (Signed)
Left message to callback to discuss pap results

## 2015-11-18 NOTE — Telephone Encounter (Signed)
I spoke with Ms Kanning regarding her upcoming colposcopy scheduled 12/02/15. She understood and is agreeable to her benefit and scheduled arrival date/time.  There was some difficulty understanding her due to a language barrier.  She requested a call to discuss her recent results. She stated we talked to her about abnormal results leading to her colposcopy appointment. She stated she received a letter from [place] and is confused. She would like to discuss this with Joy if available, if not our triage nurse.

## 2015-11-18 NOTE — Telephone Encounter (Signed)
Talked to patient about pap smear & pt states she understands. Pt has colposcopy already scheduled.

## 2015-11-24 ENCOUNTER — Encounter: Payer: Self-pay | Admitting: Certified Nurse Midwife

## 2015-12-02 ENCOUNTER — Ambulatory Visit (INDEPENDENT_AMBULATORY_CARE_PROVIDER_SITE_OTHER): Payer: 59 | Admitting: Certified Nurse Midwife

## 2015-12-02 ENCOUNTER — Encounter: Payer: Self-pay | Admitting: Certified Nurse Midwife

## 2015-12-02 VITALS — BP 102/68 | HR 70 | Resp 16 | Ht 62.75 in | Wt 113.0 lb

## 2015-12-02 DIAGNOSIS — R8789 Other abnormal findings in specimens from female genital organs: Secondary | ICD-10-CM

## 2015-12-02 DIAGNOSIS — B977 Papillomavirus as the cause of diseases classified elsewhere: Secondary | ICD-10-CM

## 2015-12-02 DIAGNOSIS — R87618 Other abnormal cytological findings on specimens from cervix uteri: Secondary | ICD-10-CM

## 2015-12-02 NOTE — Progress Notes (Signed)
Hx of 2016 pap ASCUS HPV HR +,  pap 10-29-14 neg HPV HR + Pt took 400mg  ibuprofen at 12:00

## 2015-12-02 NOTE — Patient Instructions (Signed)

## 2015-12-02 NOTE — Progress Notes (Addendum)
Patient ID: Renee Watkins, female   DOB: Dec 17, 1945, 70 y.o.   MRN: [id number]  Chief Complaint  Patient presents with  . Colposcopy    //jj    HPI Pablo Ferko is a 70 y.o. Asian g2p2002 divorced female.  Here for colposcopy exam. Denies vaginal bleeding or pelvic pain. HPI  Indications: Pap smear on 11/03/15 showed: + HPVHR. Previous colposcopy: 11/19/14 showed HPV effect with atrophy only. . Prior cervical treatment: no treatment.  Past Medical History:  Diagnosis Date  . Abnormal Pap smear of cervix 09/30/2013   neg pap HPV HR +, 8-10-16ASCUS HPV HR+, 8/17 neg HPV HR+  . Bilateral artificial lens implant   . Hypertension   . Osteoporosis   . STD (sexually transmitted disease)    HSV    Past Surgical History:  Procedure Laterality Date  . Base Wedge Osteomy Right 03/10/10   right foot  . BUNIONECTOMY Right 03/08/12   Keller right foot  . Capsulotomy IPJ Right 03/08/12   Right foot 2  . CATARACT EXTRACTION     both eyes  . COLONOSCOPY WITH PROPOFOL N/A 07/08/2014   Procedure: COLONOSCOPY WITH PROPOFOL;  Surgeon: Garlan Fair, MD;  Location: [facility];  Service: Endoscopy;  Laterality: N/A;  . COLPOSCOPY  10-15-13   ECC neg, polyp neg  . Thedora Hinders w/graft Right 03/10/10   right foot  . FOOT SURGERY Right 2000, [id number]  . FOOT SURGERY Left 2002, 2006, 2010  . Hammer toe repair Right 03/10/10   right foot # 5  . Hammer toe repair Left 03/02/11   left foot 3 . 5  . MANDIBLE FRACTURE SURGERY    . TUBAL LIGATION      Family History  Problem Relation Age of Onset  . Hypertension Sister   . Hypertension Brother   . Hypertension Mother     Social History Social History  Substance Use Topics  . Smoking status: Never Smoker  . Smokeless tobacco: Never Used  . Alcohol use No    Allergies  Allergen Reactions  . Penicillins Anaphylaxis    Current Outpatient Prescriptions  Medication Sig Dispense Refill  . CALCIUM PO Take by mouth daily.    Marland Kitchen lisinopril  (PRINIVIL,ZESTRIL) 10 MG tablet Take 10 mg by mouth every morning.     . Multiple Vitamin (MULTIVITAMIN) tablet Take 1 tablet by mouth daily.    . Vitamin D, Ergocalciferol, (DRISDOL) 50000 UNITS CAPS Take 50,000 Units by mouth every 30 (thirty) days.     No current facility-administered medications for this visit.     Review of Systems Review of Systems  Constitutional: Negative.   Genitourinary: Negative for vaginal bleeding, vaginal discharge and vaginal pain.  Skin: Negative.     Blood pressure 102/68, pulse 70, resp. rate 16, height 5' 2.75" (1.594 m), weight 113 lb (51.3 kg), last menstrual period 03/22/2003.  Physical Exam Physical Exam  Constitutional: She is oriented to person, place, and time. She appears well-developed and well-nourished.  Genitourinary: Vagina normal. There is no rash, tenderness or lesion on the right labia. There is no rash, tenderness or lesion on the left labia.    Neurological: She is alert and oriented to person, place, and time.  Skin: Skin is warm and dry.  Psychiatric: She has a normal mood and affect. Her behavior is normal. Judgment and thought content normal.    Data Reviewed Reviewed pap smear results regarding HPVHR. Questions addressed.  Assessment    Procedure Details  The risks  and benefits of the procedure and Written informed consent obtained.  Speculum placed in vagina atrophy noted. Cervix noted with excellent view and cervix cleansed  with saline and  acetic acid solution. Cervix viewed with 3.75, 7.5, and 15# with green filter. No abnormal areas noted. Lugols applied with staining noted on cervix and vaginal walls. ECC obtained. Monsel's applied to cervix. No active bleeding noted on removal of speculum. Colposcopy satisfactory. Patient tolerated procedure well. Instructions given.  Specimens: 1  Complications: none.     Plan    Specimen labelled and sent to Pathology. Patient will be called with results once  reviewed. Patient can resume coconut oil use in vagina in one week. Pathology showed benign squamous and endocervical epithelium, no atypia or malignancy noted. Patient will be notified of need to repeat pap smear in one year. Pap recall 08      [facility] 12/02/2015, 2:22 PM

## 2015-12-03 NOTE — Progress Notes (Signed)
Encounter reviewed Talonda Artist, MD   

## 2015-12-04 LAB — IPS OTHER TISSUE BIOPSY

## 2015-12-10 ENCOUNTER — Telehealth: Payer: Self-pay | Admitting: Certified Nurse Midwife

## 2015-12-10 NOTE — Telephone Encounter (Signed)
Patient calling for recent test results.

## 2015-12-10 NOTE — Telephone Encounter (Signed)
Spoke with patient. Patient calling requesting results from 12/02/15 biopsy. Advised patient as seen below per Melvia Heaps, CNM. Reviewed patients next AEX date 11/08/16, as requested per patient. Patient is agreeable.      Notes Recorded by Nicholes Rough, CMA on 12/10/2015 at 10:14 AM EDT Patient placed in 08 recall -left detailed message ok per DPR with results-eh  Notes Recorded by Regina Eck, CNM on 12/04/2015 at 1:34 PM EDT Notify patient that biopsy showed benign squamous and endocervical epithelium, no malignancy or atypia found. Repeat pap smear in one year. Continue coconut oil for dryness vaginally. Pap recall 02   Routing to provider for final review. Patient is agreeable to disposition. Will close encounter.

## 2016-11-08 ENCOUNTER — Other Ambulatory Visit (HOSPITAL_COMMUNITY)
Admission: RE | Admit: 2016-11-08 | Discharge: 2016-11-08 | Disposition: A | Discharge status: Home / Self Care | Payer: 59 | Source: Ambulatory Visit | Attending: Certified Nurse Midwife | Admitting: Certified Nurse Midwife

## 2016-11-08 ENCOUNTER — Ambulatory Visit (INDEPENDENT_AMBULATORY_CARE_PROVIDER_SITE_OTHER): Payer: 59 | Admitting: Certified Nurse Midwife

## 2016-11-08 ENCOUNTER — Encounter: Payer: Self-pay | Admitting: Certified Nurse Midwife

## 2016-11-08 VITALS — BP 126/80 | HR 70 | Resp 16 | Ht 63.5 in | Wt 108.0 lb

## 2016-11-08 DIAGNOSIS — R87610 Atypical squamous cells of undetermined significance on cytologic smear of cervix (ASC-US): Secondary | ICD-10-CM | POA: Diagnosis not present

## 2016-11-08 DIAGNOSIS — Z124 Encounter for screening for malignant neoplasm of cervix: Secondary | ICD-10-CM | POA: Diagnosis not present

## 2016-11-08 DIAGNOSIS — Z87898 Personal history of other specified conditions: Secondary | ICD-10-CM

## 2016-11-08 DIAGNOSIS — N952 Postmenopausal atrophic vaginitis: Secondary | ICD-10-CM

## 2016-11-08 DIAGNOSIS — Z8742 Personal history of other diseases of the female genital tract: Secondary | ICD-10-CM | POA: Insufficient documentation

## 2016-11-08 DIAGNOSIS — Z01419 Encounter for gynecological examination (general) (routine) without abnormal findings: Secondary | ICD-10-CM

## 2016-11-08 DIAGNOSIS — R87810 Cervical high risk human papillomavirus (HPV) DNA test positive: Secondary | ICD-10-CM | POA: Diagnosis not present

## 2016-11-08 NOTE — Patient Instructions (Signed)

## 2016-11-08 NOTE — Progress Notes (Signed)
71 y.o. Renee Watkins Divorced  Asian Fe here for annual exam. Menopausal no HRT. Denies vaginal bleeding. Some vaginal dryness using coconut oil, with good response. Sees Dr. Laurann Montana for management of hypertension, labs and aex. Sees Dr. Chalmers Cater for osteoporosis management with Prolia use. Staying active, no other health issues today.    Patient's last menstrual period was 03/22/2003.          Sexually active: No.  The current method of family planning is tubal ligation.    Exercising: No.  exercise Smoker:  no  Health Maintenance: Pap:  10-29-14 ASCUS HPV HR+, 11-03-15 neg HPV HR + History of Abnormal Pap: yes colpo MMG:  10-24-15 category b density birads 2: neg, done 2 weeks ago-neg per patient Self Breast exams: no Colonoscopy: 2016 BMD:   2015 osteoporosis TDaP:  2010 Shingles: had done Pneumonia: had done Hep C and HIV: not done Labs: none   reports that she has never smoked. She has never used smokeless tobacco. She reports that she does not drink alcohol or use drugs.  Past Medical History:  Diagnosis Date  . Abnormal Pap smear of cervix 09/30/2013   neg pap HPV HR +, 8-10-16ASCUS HPV HR+, 8/17 neg HPV HR+  . Bilateral artificial lens implant   . Hypertension   . Osteoporosis   . STD (sexually transmitted disease)    HSV    Past Surgical History:  Procedure Laterality Date  . Base Wedge Osteomy Right 03/10/10   right foot  . BUNIONECTOMY Right 03/08/12   Keller right foot  . Capsulotomy IPJ Right 03/08/12   Right foot 2  . CATARACT EXTRACTION     both eyes  . COLONOSCOPY WITH PROPOFOL N/A 07/08/2014   Procedure: COLONOSCOPY WITH PROPOFOL;  Surgeon: Garlan Fair, MD;  Location: [facility];  Service: Endoscopy;  Laterality: N/A;  . COLPOSCOPY  10-15-13   ECC neg, polyp neg  . Thedora Hinders w/graft Right 03/10/10   right foot  . FOOT SURGERY Right 2000, [id number]  . FOOT SURGERY Left 2002, 2006, 2010  . Hammer toe repair Right 03/10/10   right foot # 5  . Hammer toe  repair Left 03/02/11   left foot 3 . 5  . MANDIBLE FRACTURE SURGERY    . TUBAL LIGATION      Current Outpatient Prescriptions  Medication Sig Dispense Refill  . CALCIUM PO Take by mouth daily.    Marland Kitchen lisinopril (PRINIVIL,ZESTRIL) 10 MG tablet Take 10 mg by mouth every morning.     . Multiple Vitamin (MULTIVITAMIN) tablet Take 1 tablet by mouth daily.    . Vitamin D, Ergocalciferol, (DRISDOL) 50000 UNITS CAPS Take 50,000 Units by mouth every 30 (thirty) days.     No current facility-administered medications for this visit.     Family History  Problem Relation Age of Onset  . Hypertension Sister   . Hypertension Brother   . Hypertension Mother     ROS:  Pertinent items are noted in HPI.  Otherwise, a comprehensive ROS was negative.  Exam:   LMP 03/22/2003    Ht Readings from Last 3 Encounters:  12/02/15 5' 2.75" (1.594 m)  11/03/15 5' 2.75" (1.594 m)  11/19/14 5\' 4"  (1.626 m)    General appearance: alert, cooperative and appears stated age Head: Normocephalic, without obvious abnormality, atraumatic Neck: no adenopathy, supple, symmetrical, trachea midline and thyroid normal to inspection and palpation Lungs: clear to auscultation bilaterally Breasts: normal appearance, no masses or tenderness, No nipple retraction or  dimpling, No nipple discharge or bleeding, No axillary or supraclavicular adenopathy, small breasts Heart: regular rate and rhythm Abdomen: soft, non-tender; no masses,  no organomegaly Extremities: extremities normal, atraumatic, no cyanosis or edema Skin: Skin color, texture, turgor normal. No rashes or lesions Lymph nodes: Cervical, supraclavicular, and axillary nodes normal. No abnormal inguinal nodes palpated Neurologic: Grossly normal   Pelvic: External genitalia:  no lesions              Urethra:  normal appearing urethra with no masses, tenderness or lesions              Bartholin's and Skene's: normal                 Vagina: normal appearing vagina  with normal color and discharge, no lesions              Cervix: cervix affixed to posterior vaginal wall with stenotic flat os              Pap taken: Yes.   Bimanual Exam:  Uterus:  normal size, contour, position, consistency, mobility, non-tender              Adnexa: normal adnexa and no mass, fullness, tenderness               Rectovaginal: Confirms               Anus:  normal sphincter tone, no lesions  Chaperone present: yes  A:  Well Woman with normal exam  Menopausal no HRT  Vaginal dryness with coconut oil use working well  Follow up pap smear from abnormal Pap of ASCUS + HPVHR, negative colpo 2016 and + HPVHR colpo negative 2017  Osteoporosis management with Prolia with Dr. Chalmers Cater  Hypertension management with PCP    P:   Reviewed health and wellness pertinent to exam  Aware of need to advise if vaginal bleeding.   Continue use as instructed, will advise if not working well  If negative repeat pap per guidelines, if not per results  Continue follow up with MD as indicated.  Pap smear: yes   counseled on breast self exam, mammography screening, feminine hygiene, adequate intake of calcium and vitamin D, diet and exercise  return annually or prn  An After Visit Summary was printed and given to the patient.

## 2016-11-10 LAB — CYTOLOGY - PAP: HPV: DETECTED

## 2016-11-14 ENCOUNTER — Telehealth: Payer: Self-pay | Admitting: *Deleted

## 2016-11-14 DIAGNOSIS — R87611 Atypical squamous cells cannot exclude high grade squamous intraepithelial lesion on cytologic smear of cervix (ASC-H): Secondary | ICD-10-CM

## 2016-11-14 DIAGNOSIS — R8789 Other abnormal findings in specimens from female genital organs: Secondary | ICD-10-CM

## 2016-11-14 DIAGNOSIS — R87618 Other abnormal cytological findings on specimens from cervix uteri: Secondary | ICD-10-CM

## 2016-11-14 NOTE — Telephone Encounter (Signed)
Left message to call Darleene Cumpian at [phone].  

## 2016-11-14 NOTE — Telephone Encounter (Signed)
Patient returning call.

## 2016-11-14 NOTE — Telephone Encounter (Signed)
-----   Message from Regina Eck, CNM sent at 11/11/2016 12:09 PM EDT ----- Notify patient that pap smear is ASCUS- H and +HPVHR. Again. She will need colposcopy again. Please schedule with MD. Order not placed

## 2016-11-14 NOTE — Telephone Encounter (Signed)
Left message to call Sherece Gambrill at [phone].  

## 2016-11-15 NOTE — Telephone Encounter (Signed)
Patient returning call to [place]. Requests a call after 3:45pm today.

## 2016-11-15 NOTE — Telephone Encounter (Signed)
Left message to call Renee Watkins at [phone].  

## 2016-11-15 NOTE — Telephone Encounter (Signed)
Spoke with patient, advised of results and recommendations as seen below per Melvia Heaps, CNM. Contraceptive, postmenopausal. Patient scheduled for colpo on 11/23/16 at 9am with Dr. Talbert Nan, patient declined earlier appt offered on 11/17/16. Advised patient to take Motrin 800 mg with food and water one hour before procedure. Patient verbalizes understanding and is agreeable.  Order placed for colpo.   Routing to provider for final review. Patient is agreeable to disposition. Will close encounter.   Cc: Melvia Heaps, CNM; Theresia Lo

## 2016-11-17 ENCOUNTER — Telehealth: Payer: Self-pay | Admitting: *Deleted

## 2016-11-17 NOTE — Telephone Encounter (Signed)
Left message to call Sharee Pimple at [phone].   Need to reschedule colposcopy, Dr. Talbert Nan will be in surgery.

## 2016-11-18 NOTE — Telephone Encounter (Signed)
Spoke with patient, colpo appointment changed to 11/23/16 at 10am with Dr. Talbert Nan. Patient is agreeable to date and time.  Routing to provider for final review. Patient is agreeable to disposition. Will close encounter.

## 2016-11-23 ENCOUNTER — Ambulatory Visit (INDEPENDENT_AMBULATORY_CARE_PROVIDER_SITE_OTHER): Payer: 59 | Admitting: Obstetrics and Gynecology

## 2016-11-23 ENCOUNTER — Ambulatory Visit: Payer: Self-pay | Admitting: Obstetrics and Gynecology

## 2016-11-23 ENCOUNTER — Telehealth: Payer: Self-pay | Admitting: Obstetrics and Gynecology

## 2016-11-23 ENCOUNTER — Encounter: Payer: Self-pay | Admitting: Obstetrics and Gynecology

## 2016-11-23 VITALS — BP 124/70 | HR 84 | Resp 16 | Wt 112.0 lb

## 2016-11-23 DIAGNOSIS — R8789 Other abnormal findings in specimens from female genital organs: Secondary | ICD-10-CM

## 2016-11-23 DIAGNOSIS — R87618 Other abnormal cytological findings on specimens from cervix uteri: Secondary | ICD-10-CM

## 2016-11-23 DIAGNOSIS — R87611 Atypical squamous cells cannot exclude high grade squamous intraepithelial lesion on cytologic smear of cervix (ASC-H): Secondary | ICD-10-CM | POA: Diagnosis not present

## 2016-11-23 NOTE — Progress Notes (Signed)
GYNECOLOGY  VISIT   HPI: 71 y.o.   Divorced  Asian  female   G2P2002 with Patient's last menstrual period was 03/22/2003.   here for a colposcopy. Recent pap with ASC-H, +HPV     GYNECOLOGIC HISTORY: Patient's last menstrual period was 03/22/2003. Contraception:postmenopause  Menopausal hormone therapy: none         OB History    Gravida Para Term Preterm AB Living   2 2 2     2    SAB TAB Ectopic Multiple Live Births           2         Patient Active Problem List   Diagnosis Date Noted  . History of abnormal cervical Pap smear 11/08/2016  . Nail fungus 06/22/2012    Past Medical History:  Diagnosis Date  . Abnormal Pap smear of cervix 09/30/2013   neg pap HPV HR +, 8-10-16ASCUS HPV HR+, 8/17 neg HPV HR+  . Bilateral artificial lens implant   . Hypertension   . Osteoporosis   . STD (sexually transmitted disease)    HSV    Past Surgical History:  Procedure Laterality Date  . Base Wedge Osteomy Right 03/10/10   right foot  . BUNIONECTOMY Right 03/08/12   Keller right foot  . Capsulotomy IPJ Right 03/08/12   Right foot 2  . CATARACT EXTRACTION     both eyes  . COLONOSCOPY WITH PROPOFOL N/A 07/08/2014   Procedure: COLONOSCOPY WITH PROPOFOL;  Surgeon: Garlan Fair, MD;  Location: [facility];  Service: Endoscopy;  Laterality: N/A;  . COLPOSCOPY     2015, ECC neg, polyp neg, 2016 & 2017  . Thedora Hinders w/graft Right 03/10/10   right foot  . FOOT SURGERY Right 2000, [id number]  . FOOT SURGERY Left 2002, 2006, 2010  . Hammer toe repair Right 03/10/10   right foot # 5  . Hammer toe repair Left 03/02/11   left foot 3 . 5  . MANDIBLE FRACTURE SURGERY    . TUBAL LIGATION      Current Outpatient Prescriptions  Medication Sig Dispense Refill  . CALCIUM PO Take by mouth daily.    . Cholecalciferol (VITAMIN D PO) Take 2,000 Int'l Units by mouth daily.    Marland Kitchen lisinopril (PRINIVIL,ZESTRIL) 10 MG tablet Take 10 mg by mouth every morning.     . Multiple Vitamin  (MULTIVITAMIN) tablet Take 1 tablet by mouth daily.    . Vitamin D, Ergocalciferol, (DRISDOL) 50000 UNITS CAPS Take 50,000 Units by mouth every 30 (thirty) days.     No current facility-administered medications for this visit.      ALLERGIES: Penicillins  Family History  Problem Relation Age of Onset  . Hypertension Sister   . Hypertension Brother   . Hypertension Mother     Social History   Social History  . Marital status: Divorced    Spouse name: N/A  . Number of children: N/A  . Years of education: N/A   Occupational History  . Not on file.   Social History Main Topics  . Smoking status: Never Smoker  . Smokeless tobacco: Never Used  . Alcohol use No  . Drug use: No  . Sexual activity: No     Comment: BTL   Other Topics Concern  . Not on file   Social History Narrative  . No narrative on file    Review of Systems  Constitutional: Negative.   HENT: Negative.   Eyes: Negative.   Respiratory:  Negative.   Cardiovascular: Negative.   Gastrointestinal: Negative.   Genitourinary: Negative.   Musculoskeletal: Negative.   Skin: Negative.   Neurological: Negative.   Endo/Heme/Allergies: Negative.   Psychiatric/Behavioral: Negative.     PHYSICAL EXAMINATION:    BP 124/70 (BP Location: Right Arm, Patient Position: Sitting, Cuff Size: Normal)   Pulse 84   Resp 16   Wt 112 lb (50.8 kg)   LMP 03/22/2003   BMI 19.53 kg/m     General appearance: alert, cooperative and appears stated age Abdomen: no scars seen.   Pelvic: External genitalia:  no lesions              Urethra:  normal appearing urethra with no masses, tenderness or lesions              Bartholins and Skenes: normal                 Vagina: normal appearing vagina with normal color and discharge, no lesions              Cervix: not seen Bimanual exam: cervix not felt, uterus not palpated RV exam: ? Possible uterus posteriorly vs bowel  Colposcopy: Cervix not seen, vagina appears fused  anteriorly to posteriorly (denies h/o hysterectomy). No aceto-white changes noted. There is an area of sightly increased pigmentation of the vagina just under the fusion at 6 o'clock, biopsy taken. Decreased lugols throughout the entire vagina, no lesions. Biopsy taken at 12 o'clock.    Chaperone was present for exam.    ASSESSMENT ASC-H, +HPV, no cervix seen on colposcopy (patient denies history of hysterectomy)    PLAN Biopsies sent Consider vaginal estrogen and ultrasound to better evaluate, if she does have a cervix, it is not being evaluated currently Will await biopsy results prior to future plan.   An After Visit Summary was printed and given to the patient.  CC: Evalee Mutton, CNM

## 2016-11-23 NOTE — Telephone Encounter (Signed)
11/23/16 lmtcb re: dr cx procedure today due to surgery/offer 12:45 or 4:00 today/[place]

## 2016-11-23 NOTE — Patient Instructions (Signed)

## 2016-12-01 ENCOUNTER — Telehealth: Payer: Self-pay

## 2016-12-01 DIAGNOSIS — R87623 High grade squamous intraepithelial lesion on cytologic smear of vagina (HGSIL): Secondary | ICD-10-CM

## 2016-12-01 NOTE — Telephone Encounter (Signed)
-----   Message from Salvadore Dom, MD sent at 11/30/2016 12:49 PM EDT ----- Please let the patient know that her biopsies both returned with high grade dysplasia. I was unable to see her cervix at the time of colposcopy, the patient denies a h/o hysterectomy. My concern is that we are not evaluating her cervix because her vaginal has fused in front of it. Please set her up to return for a gyn ultrasound (I think there is a code for unsatisfactory exam) to look at her uterus/cervix. We should also set her up to see Dr Denman George. I'm concerned that her dysplasia is diffuse (the biopsies I took were random, her entire vagina had decreased lugols uptake as is often seen with atrophy), and concerned that she could have dysplasia in a cervix that is not being visualized.

## 2016-12-01 NOTE — Telephone Encounter (Signed)
Left message to call Kaitlyn at [phone]. 

## 2016-12-05 NOTE — Telephone Encounter (Signed)
Spoke with patient. Results given as seen below from [place]. Patient verbalizes understanding. Appointment for PUS scheduled for 12/20/2016 at 4 pm with 4:30 pm consult with Dr.Jertson. Patient declines all earlier appointments offered. Referral placed to [facility]. Patient is aware she will be contacted to schedule this appointment.  Cc: Magdalene Patricia, Lerry Liner regarding referral  Routing to provider for final review. Patient agreeable to disposition. Will close encounter.

## 2016-12-05 NOTE — Telephone Encounter (Signed)
Routing to [employer] and [employer]  regarding referral to [facility]. Appointment will need to be after 12/20/2016 PUS with Dr.Jertson.

## 2016-12-05 NOTE — Telephone Encounter (Signed)
Forwarding this to [employer].  This came through to my inbox, Dr. Quincy Simmonds, in error.

## 2016-12-05 NOTE — Telephone Encounter (Signed)
Please make sure the appointment with Dr Denman George is after her ultrasound. She will need this information.

## 2016-12-20 ENCOUNTER — Telehealth: Payer: Self-pay | Admitting: Obstetrics and Gynecology

## 2016-12-20 ENCOUNTER — Other Ambulatory Visit: Payer: Self-pay | Admitting: *Deleted

## 2016-12-20 ENCOUNTER — Other Ambulatory Visit: Payer: Self-pay | Admitting: Obstetrics and Gynecology

## 2016-12-20 ENCOUNTER — Ambulatory Visit (INDEPENDENT_AMBULATORY_CARE_PROVIDER_SITE_OTHER): Payer: 59 | Admitting: Obstetrics and Gynecology

## 2016-12-20 ENCOUNTER — Ambulatory Visit (INDEPENDENT_AMBULATORY_CARE_PROVIDER_SITE_OTHER): Payer: 59

## 2016-12-20 ENCOUNTER — Encounter: Payer: Self-pay | Admitting: Obstetrics and Gynecology

## 2016-12-20 VITALS — BP 132/68 | HR 84 | Resp 16 | Wt 111.0 lb

## 2016-12-20 DIAGNOSIS — N889 Noninflammatory disorder of cervix uteri, unspecified: Secondary | ICD-10-CM

## 2016-12-20 DIAGNOSIS — N893 Dysplasia of vagina, unspecified: Secondary | ICD-10-CM

## 2016-12-20 DIAGNOSIS — R87629 Unspecified abnormal cytological findings in specimens from vagina: Secondary | ICD-10-CM

## 2016-12-20 NOTE — Telephone Encounter (Signed)
Spoke with patient regarding benefit for scheduled ultrasound. Patient understood and agreeable.  Patient scheduled 12/20/16 with Talbert Nan. Patient aware of appointment date, arrival time and cancellation policy. No further questions. Ok to close

## 2016-12-20 NOTE — Progress Notes (Signed)
GYNECOLOGY  VISIT   HPI: 71 y.o.   Divorced  Asian  female   G2P2002 with Patient's last menstrual period was 03/22/2003.   here for F/U abnormal pap. The patient was seen on 11/23/16 for a colposcopy for an ASC-H pap with +HPV. On exam the anterior and posterior vagina were fused together, no cervix was seen. The patient denies a h/o a hysterectomy. On BM exam no uterus was palpated. On RV exam ?RV uterus vs bowel felt posteriorly. There was one area of increased pigmentation of the vagina just under the fusion at 6 o'clock, other wise there was decreased lugols uptake throughout the vagina. Another random biopsy was taken. Both biopsies returned with high grade dysplasia.  The patient is here for an ultrasound to confirm that she has a uterus and cervix, since it is not obvious on exam.    GYNECOLOGIC HISTORY: Patient's last menstrual period was 03/22/2003. Contraception:postmenopause  Menopausal hormone therapy: none         OB History    Gravida Para Term Preterm AB Living   2 2 2     2    SAB TAB Ectopic Multiple Live Births           2         Patient Active Problem List   Diagnosis Date Noted  . History of abnormal cervical Pap smear 11/08/2016  . Nail fungus 06/22/2012    Past Medical History:  Diagnosis Date  . Abnormal Pap smear of cervix 09/30/2013   neg pap HPV HR +, 8-10-16ASCUS HPV HR+, 8/17 neg HPV HR+  . Bilateral artificial lens implant   . Hypertension   . Osteoporosis   . STD (sexually transmitted disease)    HSV    Past Surgical History:  Procedure Laterality Date  . Base Wedge Osteomy Right 03/10/10   right foot  . BUNIONECTOMY Right 03/08/12   Keller right foot  . Capsulotomy IPJ Right 03/08/12   Right foot 2  . CATARACT EXTRACTION     both eyes  . COLONOSCOPY WITH PROPOFOL N/A 07/08/2014   Procedure: COLONOSCOPY WITH PROPOFOL;  Surgeon: Garlan Fair, MD;  Location: [facility];  Service: Endoscopy;  Laterality: N/A;  . COLPOSCOPY     2015, ECC neg, polyp neg, 2016 & 2017  . Thedora Hinders w/graft Right 03/10/10   right foot  . FOOT SURGERY Right 2000, [id number]  . FOOT SURGERY Left 2002, 2006, 2010  . Hammer toe repair Right 03/10/10   right foot # 5  . Hammer toe repair Left 03/02/11   left foot 3 . 5  . MANDIBLE FRACTURE SURGERY    . TUBAL LIGATION      Current Outpatient Prescriptions  Medication Sig Dispense Refill  . CALCIUM PO Take by mouth daily.    . Cholecalciferol (VITAMIN D PO) Take 2,000 Int'l Units by mouth daily.    Marland Kitchen lisinopril (PRINIVIL,ZESTRIL) 10 MG tablet Take 10 mg by mouth every morning.     . Multiple Vitamin (MULTIVITAMIN) tablet Take 1 tablet by mouth daily.    . Vitamin D, Ergocalciferol, (DRISDOL) 50000 UNITS CAPS Take 50,000 Units by mouth every 30 (thirty) days.     No current facility-administered medications for this visit.      ALLERGIES: Penicillins  Family History  Problem Relation Age of Onset  . Hypertension Sister   . Hypertension Brother   . Hypertension Mother     Social History   Social History  .  Marital status: Divorced    Spouse name: N/A  . Number of children: N/A  . Years of education: N/A   Occupational History  . Not on file.   Social History Main Topics  . Smoking status: Never Smoker  . Smokeless tobacco: Never Used  . Alcohol use No  . Drug use: No  . Sexual activity: No     Comment: BTL   Other Topics Concern  . Not on file   Social History Narrative  . No narrative on file    Review of Systems  Constitutional: Negative.   HENT: Negative.   Eyes: Negative.   Respiratory: Negative.   Cardiovascular: Negative.   Gastrointestinal: Negative.   Genitourinary: Negative.   Musculoskeletal: Negative.   Skin: Negative.   Neurological: Negative.   Endo/Heme/Allergies: Negative.   Psychiatric/Behavioral: Negative.     PHYSICAL EXAMINATION:    BP 132/68 (BP Location: Right Arm, Patient Position: Sitting, Cuff Size: Normal)   Pulse 84    Resp 16   Wt 111 lb (50.3 kg)   LMP 03/22/2003   BMI 19.35 kg/m     General appearance: alert, cooperative and appears stated age  Ultrasound shows a retroverted uterus with a normal appearing cervix, the probe was about a cm away from the cervix on imaging (fused vagina anterior to the probe)  ASSESSMENT Abnormal pap, High grade vaginal dysplasia, unable to evaluate the cervix, fused behind the vagina.     PLAN Will refer to Dr Denman George for a consultation I don't feel safe treating the vaginal dysplasia without being able to evaluate the cervix    An After Visit Summary was printed and given to the patient.  CC: Dr Everitt Amber, Evalee Mutton

## 2016-12-20 NOTE — Telephone Encounter (Signed)
Call placed to patient to review benefits for a scheduled ultrasound. Left voicemail message requesting a return call.

## 2017-01-09 ENCOUNTER — Encounter: Payer: Self-pay | Admitting: Certified Nurse Midwife

## 2017-01-09 ENCOUNTER — Telehealth: Payer: Self-pay | Admitting: Certified Nurse Midwife

## 2017-01-09 NOTE — Telephone Encounter (Signed)
Please disregard made in error °

## 2017-01-25 ENCOUNTER — Encounter: Payer: Self-pay | Admitting: Gynecologic Oncology

## 2017-01-25 ENCOUNTER — Ambulatory Visit: Payer: 59 | Attending: Gynecologic Oncology | Admitting: Gynecologic Oncology

## 2017-01-25 ENCOUNTER — Telehealth: Payer: Self-pay | Admitting: Certified Nurse Midwife

## 2017-01-25 VITALS — BP 153/70 | HR 78 | Temp 97.9°F | Resp 18 | Ht 63.0 in | Wt 109.7 lb

## 2017-01-25 DIAGNOSIS — N891 Moderate vaginal dysplasia: Secondary | ICD-10-CM | POA: Insufficient documentation

## 2017-01-25 DIAGNOSIS — D072 Carcinoma in situ of vagina: Secondary | ICD-10-CM | POA: Diagnosis not present

## 2017-01-25 DIAGNOSIS — R87810 Cervical high risk human papillomavirus (HPV) DNA test positive: Secondary | ICD-10-CM | POA: Insufficient documentation

## 2017-01-25 DIAGNOSIS — I10 Essential (primary) hypertension: Secondary | ICD-10-CM | POA: Insufficient documentation

## 2017-01-25 DIAGNOSIS — N893 Dysplasia of vagina, unspecified: Secondary | ICD-10-CM

## 2017-01-25 DIAGNOSIS — Z880 Allergy status to penicillin: Secondary | ICD-10-CM | POA: Insufficient documentation

## 2017-01-25 DIAGNOSIS — Z79899 Other long term (current) drug therapy: Secondary | ICD-10-CM | POA: Insufficient documentation

## 2017-01-25 NOTE — Telephone Encounter (Signed)
Left message to call Bartolo Montanye at [phone].  

## 2017-01-25 NOTE — Progress Notes (Signed)
Consult Note: Gyn-Onc  Consult was requested by Dr. Talbert Nan for the evaluation of Renee Watkins 71 y.o. female  CC:  Chief Complaint  Patient presents with  . Vaginal dysplasia    Assessment/Plan:  Ms. Renee Watkins  is a 71 y.o.  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  I believe that this patient has vaginal rather than cervical dysplasia. She has a pap smear result with concordant results from the vaginal colposcopy and vaginal biopsies. I think the primary goal should be to treat the apparent vaginal disease that I believe was causing the abnormal pap smear. I would recommend vaginal 5FU (5% concentration, 1gm once weekly (at night) x 8 weeks). Repeat pap could be considered 1-2 months after completing pap.  1 Use 5% concentration cream  2 Apply zinc oxide diaper rash ointment to the labial area  3 Fill applicator (available for purchase online) to 1g mark and insert into vagina  4 Use once a week at bedtime for 8 weeks and wash hands well afterward. You should also wear a pad.  5 On rising in the morning, take a shower or bath right away. Clean the area well and apply more zinc oxide, continuing for 2-3 days.  6 Avoid sexual intercourse for at least 3 days after applying 5-fluorouracil.  7 Follow-up with physician for a pap test 4 weeks after completion of treatment     She may be a candidate for vaginal estrogen after completing the 5FU as she has significant agglutination from atrophy. Vaginal estrogen cream is also associated with positive treatment effect on vaginal dysplasia.  With respect to inability to assess the cervix, certainly this patient is at high risk for cervical pathology given her history of persistent high risk HPV and dysplasia. Her cervix cannot be assessed visibly due to agglutination. It is a reasonable option to consider hysterectomy for this patient, not to treat or prevent dysplasia (because she will continue to need  monitoring and treatment of her known vaginal disease, and there may not, in fact, be any disease in the cervix) but rather to prevent the concern regarding inability to assess the cervix.  Before considering hysterectomy I would treat the known lesions that are present in the vagina.   HPI: Renee Watkins is a 71 year old woman who is seen in consultation at the request of Dr Talbert Nan for vaginal dysplasia and agglutination.  The patient has a long standing history of abnormal paps and high risk HPV.  She had a pap in August 2016 which showed ASCUS with high risk HPV positive. Cytology was negaive in 2017 but high risk HPV remained present.  On 11/08/16 she underwent a pap which showed ASC-H with positive high risk HPV. Colposcopy of the vagina on 11/23/16 showed an agglutinated upper vagina and the cervix could not be visualized. However, there was an area of increased pigmentation of the vagina just under the fusion at 6 o'clock and a biopsy was taken. There was decreased lugols staining throughout the vagina and a biopsy was taken at 12 o'clock.  These biopsies revealed "CIN 2-3".  A TVUS was obtained on 12/20/16 to identify the cervix as none could be seen on exam. The US showed a 6.8x3.6x3.2cm uterus with a thin endometrium. The cervix was grossly normal on [place].   Current Meds:  Outpatient Encounter Medications as of 01/25/2017  Medication Sig  . CALCIUM PO Take by mouth daily.  . Cholecalciferol (VITAMIN D PO) Take  2,000 Int'l Units by mouth daily.  Marland Kitchen lisinopril (PRINIVIL,ZESTRIL) 10 MG tablet Take 10 mg by mouth every morning.   . Multiple Vitamin (MULTIVITAMIN) tablet Take 1 tablet by mouth daily.  . Vitamin D, Ergocalciferol, (DRISDOL) 50000 UNITS CAPS Take 50,000 Units by mouth every 30 (thirty) days.   No facility-administered encounter medications on file as of 01/25/2017.     Allergy:  Allergies  Allergen Reactions  . Penicillins Anaphylaxis    Social Hx:   Social History    Socioeconomic History  . Marital status: Divorced    Spouse name: Not on file  . Number of children: Not on file  . Years of education: Not on file  . Highest education level: Not on file  Social Needs  . Financial resource strain: Not on file  . Food insecurity - worry: Not on file  . Food insecurity - inability: Not on file  . Transportation needs - medical: Not on file  . Transportation needs - non-medical: Not on file  Occupational History  . Not on file  Tobacco Use  . Smoking status: Never Smoker  . Smokeless tobacco: Never Used  Substance and Sexual Activity  . Alcohol use: No  . Drug use: No  . Sexual activity: No    Birth control/protection: Surgical    Comment: BTL  Other Topics Concern  . Not on file  Social History Narrative  . Not on file    Past Surgical Hx:  Past Surgical History:  Procedure Laterality Date  . Base Wedge Osteomy Right 03/10/10   right foot  . BUNIONECTOMY Right 03/08/12   Keller right foot  . Capsulotomy IPJ Right 03/08/12   Right foot 2  . CATARACT EXTRACTION     both eyes  . COLPOSCOPY     2015, ECC neg, polyp neg, 2016 & 2017  . Thedora Hinders w/graft Right 03/10/10   right foot  . FOOT SURGERY Right 2000, [id number]  . FOOT SURGERY Left 2002, 2006, 2010  . Hammer toe repair Right 03/10/10   right foot # 5  . Hammer toe repair Left 03/02/11   left foot 3 . 5  . MANDIBLE FRACTURE SURGERY    . TUBAL LIGATION      Past Medical Hx:  Past Medical History:  Diagnosis Date  . Abnormal Pap smear of cervix 09/30/2013   neg pap HPV HR +, 8-10-16ASCUS HPV HR+, 8/17 neg HPV HR+  . Bilateral artificial lens implant   . Hypertension   . Osteoporosis   . STD (sexually transmitted disease)    HSV    Past Gynecological History:  + high risk HPV Patient's last menstrual period was 03/22/2003.  Family Hx:  Family History  Problem Relation Age of Onset  . Hypertension Sister   . Hypertension Brother   . Hypertension Mother      Review of Systems:  Constitutional  Feels well,    ENT Normal appearing ears and nares bilaterally Skin/Breast  No rash, sores, jaundice, itching, dryness Cardiovascular  No chest pain, shortness of breath, or edema  Pulmonary  No cough or wheeze.  Gastro Intestinal  No nausea, vomitting, or diarrhoea. No bright red blood per rectum, no abdominal pain, change in bowel movement, or constipation.  Genito Urinary  No frequency, urgency, dysuria, no bleeding or discharge. Not sexually active Musculo Skeletal  No myalgia, arthralgia, joint swelling or pain  Neurologic  No weakness, numbness, change in gait,  Psychology  No depression, anxiety, insomnia.  Vitals:  Blood pressure (!) 153/70, pulse 78, temperature 97.9 F (36.6 C), temperature source Oral, resp. rate 18, height 5\' 3"  (1.6 m), weight 109 lb 11.2 oz (49.8 kg), last menstrual period 03/22/2003, SpO2 100 %.  Physical Exam: WD in NAD Neck  Supple NROM, without any enlargements.  Lymph Node Survey No cervical supraclavicular or inguinal adenopathy Cardiovascular  Pulse normal rate, regularity and rhythm. S1 and S2 normal.  Lungs  Clear to auscultation bilateraly, without wheezes/crackles/rhonchi. Good air movement.  Skin  No rash/lesions/breakdown  Psychiatry  Alert and oriented to person, place, and time  Abdomen  Normoactive bowel sounds, abdomen soft, non-tender and very thin without evidence of hernia.  Back No CVA tenderness Genito Urinary  Vulva/vagina: Normal external female genitalia.   No lesions. No discharge or bleeding.  Bladder/urethra:  No lesions or masses, well supported bladder  Vagina: atrophic, no gross lesions seen suggestive of invasive carcinoma, and no palpable masses. The biopsy sites are seen. There is agglutination of the upper vagina such that the cervix cannot be visualized.   Cervix: unable to be seen but palpably normal on rectovaginal exam.  Uterus:  Small, mobile, no  parametrial involvement or nodularity on RV exam  Adnexa: no masses. Rectal  Good tone, no masses no cul de sac nodularity.  Extremities  No bilateral cyanosis, clubbing or edema.   Donaciano Eva, MD  01/25/2017, 5:45 PM

## 2017-01-25 NOTE — Telephone Encounter (Signed)
Patient says she seen Dr Denman George and would like to discuss that appointment with Dr Talbert Nan.

## 2017-01-25 NOTE — Patient Instructions (Signed)
Dr Denman George is recommending that you follow-up with Dr Talbert Nan for treatment of your vaginal dysplasia with medication. After that has been treated, you could be considered for a hysterectomy to remove the cervix because it is difficult to evaluate your cervix and it is at risk for dysplasia. However, hysterectomy will not prevent dysplasia occurring in the vagina.

## 2017-01-30 NOTE — Telephone Encounter (Signed)
Patient returning your call.

## 2017-01-30 NOTE — Telephone Encounter (Signed)
Left message to call Renee Watkins at [phone].  

## 2017-01-31 NOTE — Telephone Encounter (Signed)
Left message to call Kaitlyn at [phone]. 

## 2017-01-31 NOTE — Telephone Encounter (Signed)
Patient returned call to Kaitlyn. °

## 2017-01-31 NOTE — Telephone Encounter (Signed)
Return call to patient. Left message to call back and can speak to any triage nurse. Brandon Melnick.

## 2017-01-31 NOTE — Telephone Encounter (Signed)
Patient returning Jill's call.  °

## 2017-02-01 NOTE — Telephone Encounter (Signed)
Spoke with patient. Patient would like to schedule an appointment to speak with Dr.Jertson about her appointment with Dr.Rossi. Offered appointment tomorrow morning but patient declines. States she works 6 am to 3:30 pm and needs an appointment at 4 pm or after. Appointment scheduled for 02/07/2017 at 4:30 pm with Dr.Jertson. Patient is agreeable.  Routing to provider for final review. Patient agreeable to disposition. Will close encounter.

## 2017-02-07 ENCOUNTER — Encounter: Payer: Self-pay | Admitting: Obstetrics and Gynecology

## 2017-02-07 ENCOUNTER — Other Ambulatory Visit: Payer: Self-pay

## 2017-02-07 ENCOUNTER — Ambulatory Visit (INDEPENDENT_AMBULATORY_CARE_PROVIDER_SITE_OTHER): Payer: 59 | Admitting: Obstetrics and Gynecology

## 2017-02-07 VITALS — BP 118/70 | HR 88 | Resp 16 | Wt 110.8 lb

## 2017-02-07 DIAGNOSIS — N893 Dysplasia of vagina, unspecified: Secondary | ICD-10-CM

## 2017-02-07 NOTE — Progress Notes (Signed)
GYNECOLOGY  VISIT   HPI: 71 y.o.   Divorced  Asian  female   G2P2002 with Patient's last menstrual period was 03/22/2003.   here for  Discuss appt with Dr. Denman George. The patient has high grade vaginal dysplasia. She has agglutination of her vagina below her cervix and her cervix is not visable. Uterus is present on ultrasound. She went for a consultation with Dr Denman George who recommended treatment with 5 FU weekly for 8 weeks, then f/u pap 4-8 weeks after finishing the 5FU. She also recommended treatment with vaginal estrogen after completing the 5FU. Once her vaginal dysplasia is treated will plan hysterectomy because of the inability to evaluate her cervix, if she still has vaginal disease she may need partial vaginectomy  GYNECOLOGIC HISTORY: Patient's last menstrual period was 03/22/2003. Contraception:Postmenopausal Menopausal hormone therapy: none        OB History    Gravida Para Term Preterm AB Living   2 2 2     2    SAB TAB Ectopic Multiple Live Births           2         Patient Active Problem List   Diagnosis Date Noted  . History of abnormal cervical Pap smear 11/08/2016  . Nail fungus 06/22/2012    Past Medical History:  Diagnosis Date  . Abnormal Pap smear of cervix 09/30/2013   neg pap HPV HR +, 8-10-16ASCUS HPV HR+, 8/17 neg HPV HR+  . Bilateral artificial lens implant   . Hypertension   . Osteoporosis   . STD (sexually transmitted disease)    HSV    Past Surgical History:  Procedure Laterality Date  . Base Wedge Osteomy Right 03/10/10   right foot  . BUNIONECTOMY Right 03/08/12   Keller right foot  . Capsulotomy IPJ Right 03/08/12   Right foot 2  . CATARACT EXTRACTION     both eyes  . COLONOSCOPY WITH PROPOFOL N/A 07/08/2014   Procedure: COLONOSCOPY WITH PROPOFOL;  Surgeon: Garlan Fair, MD;  Location: [facility];  Service: Endoscopy;  Laterality: N/A;  . COLPOSCOPY     2015, ECC neg, polyp neg, 2016 & 2017  . Thedora Hinders w/graft Right 03/10/10    right foot  . FOOT SURGERY Right 2000, [id number]  . FOOT SURGERY Left 2002, 2006, 2010  . Hammer toe repair Right 03/10/10   right foot # 5  . Hammer toe repair Left 03/02/11   left foot 3 . 5  . MANDIBLE FRACTURE SURGERY    . TUBAL LIGATION      Current Outpatient Medications  Medication Sig Dispense Refill  . CALCIUM PO Take by mouth daily.    . Cholecalciferol (VITAMIN D PO) Take 2,000 Int'l Units by mouth daily.    Marland Kitchen lisinopril (PRINIVIL,ZESTRIL) 10 MG tablet Take 10 mg by mouth every morning.     . Multiple Vitamin (MULTIVITAMIN) tablet Take 1 tablet by mouth daily.    . Vitamin D, Ergocalciferol, (DRISDOL) 50000 UNITS CAPS Take 50,000 Units by mouth every 30 (thirty) days.     No current facility-administered medications for this visit.      ALLERGIES: Penicillins  Family History  Problem Relation Age of Onset  . Hypertension Sister   . Hypertension Brother   . Hypertension Mother     Social History   Socioeconomic History  . Marital status: Divorced    Spouse name: Not on file  . Number of children: Not on file  . Years of  education: Not on file  . Highest education level: Not on file  Social Needs  . Financial resource strain: Not on file  . Food insecurity - worry: Not on file  . Food insecurity - inability: Not on file  . Transportation needs - medical: Not on file  . Transportation needs - non-medical: Not on file  Occupational History  . Not on file  Tobacco Use  . Smoking status: Never Smoker  . Smokeless tobacco: Never Used  Substance and Sexual Activity  . Alcohol use: No  . Drug use: No  . Sexual activity: No    Birth control/protection: Surgical    Comment: BTL  Other Topics Concern  . Not on file  Social History Narrative  . Not on file    Review of Systems  Constitutional: Negative.   HENT: Negative.   Eyes: Negative.   Respiratory: Negative.   Cardiovascular: Negative.   Gastrointestinal: Negative.   Genitourinary: Negative.    Musculoskeletal: Negative.   Skin: Negative.   Neurological: Negative.   Endo/Heme/Allergies: Negative.   Psychiatric/Behavioral: Negative.     PHYSICAL EXAMINATION:    BP 118/70 (BP Location: Right Arm, Patient Position: Sitting, Cuff Size: Normal)   Pulse 88   Resp 16   Wt 110 lb 12.8 oz (50.3 kg)   LMP 03/22/2003   BMI 19.63 kg/m     General appearance: alert, cooperative and appears stated age  ASSESSMENT VAIN II-III Agglutination of the vagina making evaluation of the cervix not possible    PLAN Will treat with 5 FU, 5%, 1 gram weekly 1 x a week at hs Will have her f/u prior to initial treatment (to help her with understanding application) and then again prior to her 3 rd dose to make sure her vagina isn't too irritated Will plan a pap smear 1-2 months after finishing the 5FU Will add vaginal estrogen at the end of the 5FU treatment or if needed, during the treatment    An After Visit Summary was printed and given to the patient.  Over 30 minutes face to face time of which over 50% was spent in counseling.   CC: Evalee Mutton, CNM

## 2017-02-08 ENCOUNTER — Telehealth: Payer: Self-pay

## 2017-02-08 MED ORDER — NONFORMULARY OR COMPOUNDED ITEM: 0 refills | Status: DC

## 2017-02-08 NOTE — Telephone Encounter (Signed)
-----   Message from Salvadore Dom, MD sent at 02/07/2017  5:55 PM EST ----- This patient has vaginal dysplasia. The following is Dr Serita Grit recommendation:   would recommend vaginal 5FU (5% concentration, 1gm once weekly (at night) x 8 weeks). Repeat pap could be considered 1-2 months after completing pap.  1 Use 5% concentration cream 2 Apply zinc oxide diaper rash ointment to the labial area 3 Fill applicator (available for purchase online) to 1g mark and insert into vagina 4 Use once a week at bedtime for 8 weeks and wash hands well afterward. You should also wear a pad. 5 On rising in the morning, take a shower or bath right away. Clean the area well and apply more zinc oxide, continuing for 2-3 days. 6 Avoid sexual intercourse for at least 3 days after applying 5-fluorouracil. 7 Follow-up with physician for a pap test 4 weeks after completion of treatment  She recommends 1 gram, I think this patient would benefit from the syringes that come filled from the compounded pharmacy with the correct dose.  She can use zinc oxide or petroleum jelly as a barrier Please set her up for an appointment at the end of a day and have her bring her medicine with her, I want to make sure she understands prior to using it. Can you also print the above instructions.  She should also have an appointment prior to her 3 rd dose.  Thanks, Sharee Pimple

## 2017-02-08 NOTE — Telephone Encounter (Signed)
Left message to call Kaitlyn at [phone]. 

## 2017-02-08 NOTE — Telephone Encounter (Signed)
Rx written and to [place] for review and signature before faxing to [facility].

## 2017-02-14 NOTE — Telephone Encounter (Signed)
Spoke with patient in person. Patient walked into office. Advised of all recommendations and instructions as seen below. Printed inductions given to patient. Rx for 5FU faxed to [facility]. Provided address and phone number of pharmacy to patient. Advised patient she will be contacted when the prescription is ready for pick up. Will need to bring the medication and applicators to the office. Appointment scheduled for 02/27/2017 with Dr.Jertson at 4 pm. This is the earliest appointment the patient can take due to her work schedule. Will need to schedule an appointment prior to her 3rd dose while in office.  Routing to provider for final review. Patient agreeable to disposition. Will close encounter.

## 2017-02-26 ENCOUNTER — Telehealth: Payer: Self-pay | Admitting: Obstetrics and Gynecology

## 2017-02-26 NOTE — Telephone Encounter (Signed)
discuss 5FU, will need to schedule an appointment after 2 weeks for follow up/ks  02/26/17 lm re: dr cx due to inclement weather/[place]  Routing to [place] for rescheduling.

## 2017-02-27 ENCOUNTER — Ambulatory Visit: Payer: 59 | Admitting: Obstetrics and Gynecology

## 2017-02-28 NOTE — Telephone Encounter (Signed)
Left message to call regarding R/S -eh

## 2017-02-28 NOTE — Telephone Encounter (Signed)
Spoke with patient and R/S 03-09-17 -eh

## 2017-03-09 ENCOUNTER — Other Ambulatory Visit: Payer: Self-pay

## 2017-03-09 ENCOUNTER — Ambulatory Visit (INDEPENDENT_AMBULATORY_CARE_PROVIDER_SITE_OTHER): Payer: 59 | Admitting: Obstetrics and Gynecology

## 2017-03-09 ENCOUNTER — Encounter: Payer: Self-pay | Admitting: Obstetrics and Gynecology

## 2017-03-09 VITALS — BP 130/76 | HR 88 | Resp 14 | Wt 108.0 lb

## 2017-03-09 DIAGNOSIS — N893 Dysplasia of vagina, unspecified: Secondary | ICD-10-CM | POA: Diagnosis not present

## 2017-03-09 NOTE — Progress Notes (Signed)
GYNECOLOGY  VISIT   HPI: 71 y.o.   Divorced  Asian  female   [id number] with Patient's last menstrual period was 03/22/2003.   here to go over her use of the 5FU, the patient brought the medication with her.   GYNECOLOGIC HISTORY: Patient's last menstrual period was 03/22/2003. Contraception:Postmenopausal  Menopausal hormone therapy: none        OB History    Gravida Para Term Preterm AB Living   2 2 2     2    SAB TAB Ectopic Multiple Live Births           2         Patient Active Problem List   Diagnosis Date Noted  . History of abnormal cervical Pap smear 11/08/2016  . Nail fungus 06/22/2012    Past Medical History:  Diagnosis Date  . Abnormal Pap smear of cervix 09/30/2013   neg pap HPV HR +, 8-10-16ASCUS HPV HR+, 8/17 neg HPV HR+  . Bilateral artificial lens implant   . Hypertension   . Osteoporosis   . STD (sexually transmitted disease)    HSV    Past Surgical History:  Procedure Laterality Date  . Base Wedge Osteomy Right 03/10/10   right foot  . BUNIONECTOMY Right 03/08/12   Keller right foot  . Capsulotomy IPJ Right 03/08/12   Right foot 2  . CATARACT EXTRACTION     both eyes  . COLONOSCOPY WITH PROPOFOL N/A 07/08/2014   Procedure: COLONOSCOPY WITH PROPOFOL;  Surgeon: Garlan Fair, MD;  Location: [facility];  Service: Endoscopy;  Laterality: N/A;  . COLPOSCOPY     2015, ECC neg, polyp neg, 2016 & 2017  . Thedora Hinders w/graft Right 03/10/10   right foot  . FOOT SURGERY Right 2000, [id number]  . FOOT SURGERY Left 2002, 2006, 2010  . Hammer toe repair Right 03/10/10   right foot # 5  . Hammer toe repair Left 03/02/11   left foot 3 . 5  . MANDIBLE FRACTURE SURGERY    . TUBAL LIGATION      Current Outpatient Medications  Medication Sig Dispense Refill  . CALCIUM PO Take by mouth daily.    . Cholecalciferol (VITAMIN D PO) Take 2,000 Int'l Units by mouth daily.    Marland Kitchen lisinopril (PRINIVIL,ZESTRIL) 10 MG tablet Take 10 mg by mouth every morning.     .  Multiple Vitamin (MULTIVITAMIN) tablet Take 1 tablet by mouth daily.    . NONFORMULARY OR COMPOUNDED ITEM Vaginal 5FU (5% concentration, 1gm once weekly (at night) x 8 weeks). Patient will need vaginal applicators. Dispense 8 grams 0RF 1 each 0  . Vitamin D, Ergocalciferol, (DRISDOL) 50000 UNITS CAPS Take 50,000 Units by mouth every 30 (thirty) days.     No current facility-administered medications for this visit.      ALLERGIES: Penicillins  Family History  Problem Relation Age of Onset  . Hypertension Sister   . Hypertension Brother   . Hypertension Mother     Social History   Socioeconomic History  . Marital status: Divorced    Spouse name: Not on file  . Number of children: Not on file  . Years of education: Not on file  . Highest education level: Not on file  Social Needs  . Financial resource strain: Not on file  . Food insecurity - worry: Not on file  . Food insecurity - inability: Not on file  . Transportation needs - medical: Not on file  . Transportation  needs - non-medical: Not on file  Occupational History  . Not on file  Tobacco Use  . Smoking status: Never Smoker  . Smokeless tobacco: Never Used  Substance and Sexual Activity  . Alcohol use: No  . Drug use: No  . Sexual activity: No    Birth control/protection: Surgical    Comment: BTL  Other Topics Concern  . Not on file  Social History Narrative  . Not on file    Review of Systems  Constitutional: Negative.   HENT: Negative.   Eyes: Negative.   Respiratory: Negative.   Cardiovascular: Negative.   Gastrointestinal: Negative.   Genitourinary: Negative.   Musculoskeletal: Negative.   Skin: Negative.   Neurological: Negative.   Endo/Heme/Allergies: Negative.   Psychiatric/Behavioral: Negative.     PHYSICAL EXAMINATION:    BP 130/76 (BP Location: Right Arm, Patient Position: Sitting, Cuff Size: Normal)   Pulse 88   Resp 14   Wt 108 lb (49 kg)   LMP 03/22/2003   BMI 19.13 kg/m      General appearance: alert, cooperative and appears stated age  ASSESSMENT Vaginal dysplasia    PLAN Long discussion, instruction on how to use the 5FU, looked at the applicators with the patient, discussed how far to put it in her vaginal, discussed using Vaseline or zinc oxide externally She will use tonight and one week from tonight. F/U in 2 weeks prior to her 3 rd dose to make sure she hasn't developed significant vaginal irritation   An After Visit Summary was printed and given to the patient.  ~15 minutes face to face time of which over 50% was spent in counseling.

## 2017-03-23 ENCOUNTER — Other Ambulatory Visit: Payer: Self-pay

## 2017-03-23 ENCOUNTER — Encounter: Payer: Self-pay | Admitting: Obstetrics and Gynecology

## 2017-03-23 ENCOUNTER — Ambulatory Visit (INDEPENDENT_AMBULATORY_CARE_PROVIDER_SITE_OTHER): Payer: 59 | Admitting: Obstetrics and Gynecology

## 2017-03-23 VITALS — BP 122/64 | HR 84 | Resp 16 | Wt 106.0 lb

## 2017-03-23 DIAGNOSIS — N952 Postmenopausal atrophic vaginitis: Secondary | ICD-10-CM

## 2017-03-23 DIAGNOSIS — N893 Dysplasia of vagina, unspecified: Secondary | ICD-10-CM | POA: Diagnosis not present

## 2017-03-23 MED ORDER — ESTRADIOL 10 MCG VA TABS: 1.0000 | ORAL_TABLET | VAGINAL | 0 refills | Status: DC

## 2017-03-23 NOTE — Progress Notes (Signed)
GYNECOLOGY  VISIT   HPI: 72 y.o.   Divorced  Asian  female   G2P2002 with Patient's last menstrual period was 03/22/2003.   here for follow up vaginal dysplasia. She has used the 5FU x 2, due for the 3rd dose tonight. She is using Desitin externally, preventing irritation. No vaginal bleeding.   GYNECOLOGIC HISTORY: Patient's last menstrual period was 03/22/2003. Contraception:postmenopause  Menopausal hormone therapy: none         OB History    Gravida Para Term Preterm AB Living   2 2 2     2    SAB TAB Ectopic Multiple Live Births           2         Patient Active Problem List   Diagnosis Date Noted  . History of abnormal cervical Pap smear 11/08/2016  . Nail fungus 06/22/2012    Past Medical History:  Diagnosis Date  . Abnormal Pap smear of cervix 09/30/2013   neg pap HPV HR +, 8-10-16ASCUS HPV HR+, 8/17 neg HPV HR+  . Bilateral artificial lens implant   . Hypertension   . Osteoporosis   . STD (sexually transmitted disease)    HSV    Past Surgical History:  Procedure Laterality Date  . Base Wedge Osteomy Right 03/10/10   right foot  . BUNIONECTOMY Right 03/08/12   Keller right foot  . Capsulotomy IPJ Right 03/08/12   Right foot 2  . CATARACT EXTRACTION     both eyes  . COLONOSCOPY WITH PROPOFOL N/A 07/08/2014   Procedure: COLONOSCOPY WITH PROPOFOL;  Surgeon: Garlan Fair, MD;  Location: [facility];  Service: Endoscopy;  Laterality: N/A;  . COLPOSCOPY     2015, ECC neg, polyp neg, 2016 & 2017  . Thedora Hinders w/graft Right 03/10/10   right foot  . FOOT SURGERY Right 2000, [id number]  . FOOT SURGERY Left 2002, 2006, 2010  . Hammer toe repair Right 03/10/10   right foot # 5  . Hammer toe repair Left 03/02/11   left foot 3 . 5  . MANDIBLE FRACTURE SURGERY    . TUBAL LIGATION      Current Outpatient Medications  Medication Sig Dispense Refill  . CALCIUM PO Take by mouth daily.    . Cholecalciferol (VITAMIN D PO) Take 2,000 Int'l Units by mouth daily.     Marland Kitchen lisinopril (PRINIVIL,ZESTRIL) 10 MG tablet Take 10 mg by mouth every morning.     . Multiple Vitamin (MULTIVITAMIN) tablet Take 1 tablet by mouth daily.    . NONFORMULARY OR COMPOUNDED ITEM Vaginal 5FU (5% concentration, 1gm once weekly (at night) x 8 weeks). Patient will need vaginal applicators. Dispense 8 grams 0RF 1 each 0  . Vitamin D, Ergocalciferol, (DRISDOL) 50000 UNITS CAPS Take 50,000 Units by mouth every 30 (thirty) days.     No current facility-administered medications for this visit.      ALLERGIES: Penicillins  Family History  Problem Relation Age of Onset  . Hypertension Sister   . Hypertension Brother   . Hypertension Mother     Social History   Socioeconomic History  . Marital status: Divorced    Spouse name: Not on file  . Number of children: Not on file  . Years of education: Not on file  . Highest education level: Not on file  Social Needs  . Financial resource strain: Not on file  . Food insecurity - worry: Not on file  . Food insecurity - inability: Not  on file  . Transportation needs - medical: Not on file  . Transportation needs - non-medical: Not on file  Occupational History  . Not on file  Tobacco Use  . Smoking status: Never Smoker  . Smokeless tobacco: Never Used  Substance and Sexual Activity  . Alcohol use: No  . Drug use: No  . Sexual activity: No    Birth control/protection: Surgical    Comment: BTL  Other Topics Concern  . Not on file  Social History Narrative  . Not on file    Review of Systems  Constitutional: Negative.   HENT: Negative.   Eyes: Negative.   Respiratory: Negative.   Cardiovascular: Negative.   Gastrointestinal: Negative.   Genitourinary: Negative.   Musculoskeletal: Negative.   Skin: Negative.   Neurological: Negative.   Endo/Heme/Allergies: Negative.   Psychiatric/Behavioral: Negative.     PHYSICAL EXAMINATION:    BP 122/64 (BP Location: Right Arm, Patient Position: Sitting, Cuff Size: Normal)    Pulse 84   Resp 16   Wt 106 lb (48.1 kg)   LMP 03/22/2003   BMI 18.78 kg/m     General appearance: alert, cooperative and appears stated age  Pelvic: External genitalia:  no lesions              Urethra:  normal appearing urethra with no masses, tenderness or lesions              Bartholins and Skenes: normal                 Vagina: atrophic appearing vagina without irritation              Cervix: not seen, vagina agglutinated in front of the cervix  Chaperone was present for exam.  ASSESSMENT VAIN, s/p 2 weeks of 5FU, tolerating  Will continue for the next 6 weeks, then f/u     PLAN She will start vaginal estrogen at her 6 week visit (she will bring it in so I can show her how to use it) Pap in 3 months from now Call with any concerns   An After Visit Summary was printed and given to the patient.

## 2017-05-04 ENCOUNTER — Encounter: Payer: Self-pay | Admitting: Obstetrics and Gynecology

## 2017-05-04 ENCOUNTER — Other Ambulatory Visit: Payer: Self-pay

## 2017-05-04 ENCOUNTER — Ambulatory Visit (INDEPENDENT_AMBULATORY_CARE_PROVIDER_SITE_OTHER): Payer: 59 | Admitting: Obstetrics and Gynecology

## 2017-05-04 VITALS — BP 138/80 | HR 84 | Resp 12 | Wt 109.0 lb

## 2017-05-04 DIAGNOSIS — D072 Carcinoma in situ of vagina: Secondary | ICD-10-CM | POA: Diagnosis not present

## 2017-05-04 MED ORDER — ESTRADIOL 10 MCG VA TABS: 1.0000 | ORAL_TABLET | VAGINAL | 2 refills | Status: DC

## 2017-05-04 NOTE — Progress Notes (Signed)
GYNECOLOGY  VISIT   HPI: 72 y.o.   Divorced  Asian  female   G2P2002 with Patient's last menstrual period was 03/22/2003.   here for follow up  Vaginal dysplasia, she has completed her 5 FU treatment, no problems with the treatment. No pain, no bleeding.  She brought her vagifem tablets in so I could teach her how to use them.   GYNECOLOGIC HISTORY: Patient's last menstrual period was 03/22/2003. Contraception:postmenopause  Menopausal hormone therapy: Estradiol         OB History    Gravida Para Term Preterm AB Living   2 2 2     2    SAB TAB Ectopic Multiple Live Births           2         Patient Active Problem List   Diagnosis Date Noted  . History of abnormal cervical Pap smear 11/08/2016  . Nail fungus 06/22/2012    Past Medical History:  Diagnosis Date  . Abnormal Pap smear of cervix 09/30/2013   neg pap HPV HR +, 8-10-16ASCUS HPV HR+, 8/17 neg HPV HR+  . Bilateral artificial lens implant   . Hypertension   . Osteoporosis   . STD (sexually transmitted disease)    HSV    Past Surgical History:  Procedure Laterality Date  . Base Wedge Osteomy Right 03/10/10   right foot  . BUNIONECTOMY Right 03/08/12   Keller right foot  . Capsulotomy IPJ Right 03/08/12   Right foot 2  . CATARACT EXTRACTION     both eyes  . COLONOSCOPY WITH PROPOFOL N/A 07/08/2014   Procedure: COLONOSCOPY WITH PROPOFOL;  Surgeon: Garlan Fair, MD;  Location: [facility];  Service: Endoscopy;  Laterality: N/A;  . COLPOSCOPY     2015, ECC neg, polyp neg, 2016 & 2017  . Thedora Hinders w/graft Right 03/10/10   right foot  . FOOT SURGERY Right 2000, [id number]  . FOOT SURGERY Left 2002, 2006, 2010  . Hammer toe repair Right 03/10/10   right foot # 5  . Hammer toe repair Left 03/02/11   left foot 3 . 5  . MANDIBLE FRACTURE SURGERY    . TUBAL LIGATION      Current Outpatient Medications  Medication Sig Dispense Refill  . CALCIUM PO Take by mouth daily.    . Cholecalciferol (VITAMIN D PO)  Take 2,000 Int'l Units by mouth daily.    . Estradiol 10 MCG TABS vaginal tablet Place 1 tablet (10 mcg total) vaginally 2 (two) times a week. 12 tablet 0  . lisinopril (PRINIVIL,ZESTRIL) 10 MG tablet Take 10 mg by mouth every morning.     . Multiple Vitamin (MULTIVITAMIN) tablet Take 1 tablet by mouth daily.    . NONFORMULARY OR COMPOUNDED ITEM Vaginal 5FU (5% concentration, 1gm once weekly (at night) x 8 weeks). Patient will need vaginal applicators. Dispense 8 grams 0RF 1 each 0  . Vitamin D, Ergocalciferol, (DRISDOL) 50000 UNITS CAPS Take 50,000 Units by mouth every 30 (thirty) days.     No current facility-administered medications for this visit.      ALLERGIES: Penicillins  Family History  Problem Relation Age of Onset  . Hypertension Sister   . Hypertension Brother   . Hypertension Mother     Social History   Socioeconomic History  . Marital status: Divorced    Spouse name: Not on file  . Number of children: Not on file  . Years of education: Not on file  .  Highest education level: Not on file  Social Needs  . Financial resource strain: Not on file  . Food insecurity - worry: Not on file  . Food insecurity - inability: Not on file  . Transportation needs - medical: Not on file  . Transportation needs - non-medical: Not on file  Occupational History  . Not on file  Tobacco Use  . Smoking status: Never Smoker  . Smokeless tobacco: Never Used  Substance and Sexual Activity  . Alcohol use: No  . Drug use: No  . Sexual activity: No    Birth control/protection: Surgical    Comment: BTL  Other Topics Concern  . Not on file  Social History Narrative  . Not on file    Review of Systems  Constitutional: Negative.   HENT: Negative.   Eyes: Negative.   Respiratory: Negative.   Cardiovascular: Negative.   Gastrointestinal: Negative.   Genitourinary: Negative.   Musculoskeletal: Negative.   Skin: Negative.   Neurological: Negative.   Endo/Heme/Allergies:  Negative.   Psychiatric/Behavioral: Negative.     PHYSICAL EXAMINATION:    BP 138/80 (BP Location: Right Arm, Patient Position: Sitting, Cuff Size: Normal)   Pulse 84   Resp 12   Wt 109 lb (49.4 kg)   LMP 03/22/2003   BMI 19.31 kg/m     General appearance: alert, cooperative and appears stated age  Pelvic: External genitalia:  no lesions              Urethra:  normal appearing urethra with no masses, tenderness or lesions              Bartholins and Skenes: normal                 Vagina: short and fused anteriorly to posteriorly, cervix not seen. No vaginal irritation.  Chaperone was present for exam.  ASSESSMENT VAIN II-III, s/p treatment with 5FU, doing well Vaginal atrophy Fusion of the upper vagina, cervix not seen    PLAN Start generic vagifem, first tablet placed. Her vagina is short, if the tablets fall out will change to the cream F/U in 1-2 months for a pap smear After that will further discuss hysterectomy.    An After Visit Summary was printed and given to the patient.

## 2017-06-15 ENCOUNTER — Ambulatory Visit (INDEPENDENT_AMBULATORY_CARE_PROVIDER_SITE_OTHER): Payer: 59 | Admitting: Obstetrics and Gynecology

## 2017-06-15 ENCOUNTER — Encounter: Payer: Self-pay | Admitting: Obstetrics and Gynecology

## 2017-06-15 ENCOUNTER — Other Ambulatory Visit (HOSPITAL_COMMUNITY)
Admission: RE | Admit: 2017-06-15 | Discharge: 2017-06-15 | Disposition: A | Discharge status: Home / Self Care | Payer: 59 | Source: Ambulatory Visit | Attending: Obstetrics and Gynecology | Admitting: Obstetrics and Gynecology

## 2017-06-15 VITALS — BP 124/70 | HR 80 | Wt 110.0 lb

## 2017-06-15 DIAGNOSIS — D069 Carcinoma in situ of cervix, unspecified: Secondary | ICD-10-CM | POA: Insufficient documentation

## 2017-06-15 DIAGNOSIS — N893 Dysplasia of vagina, unspecified: Secondary | ICD-10-CM | POA: Insufficient documentation

## 2017-06-15 DIAGNOSIS — N952 Postmenopausal atrophic vaginitis: Secondary | ICD-10-CM | POA: Diagnosis not present

## 2017-06-15 NOTE — Progress Notes (Signed)
GYNECOLOGY  VISIT   HPI: 72 y.o.   Divorced  Asian  female   G2P2002 with Patient's last menstrual period was 03/22/2003.   here for follow up pap smear s/p 5FU treatment for VAIN. She is using vagifem for atrophy, her vagina is fused in front of her cervix. Unable to evaluate her cervix. She is Web designer.   GYNECOLOGIC HISTORY: Patient's last menstrual period was 03/22/2003. Contraception: postmenopausal Menopausal hormone therapy: Estradiol vaginal suppository        OB History    Gravida  2   Para  2   Term  2   Preterm      AB      Living  2     SAB      TAB      Ectopic      Multiple      Live Births  2              Patient Active Problem List   Diagnosis Date Noted  . History of abnormal cervical Pap smear 11/08/2016  . Nail fungus 06/22/2012    Past Medical History:  Diagnosis Date  . Abnormal Pap smear of cervix 09/30/2013   neg pap HPV HR +, 8-10-16ASCUS HPV HR+, 8/17 neg HPV HR+  . Bilateral artificial lens implant   . Hypertension   . Osteoporosis   . STD (sexually transmitted disease)    HSV    Past Surgical History:  Procedure Laterality Date  . Base Wedge Osteomy Right 03/10/10   right foot  . BUNIONECTOMY Right 03/08/12   Keller right foot  . Capsulotomy IPJ Right 03/08/12   Right foot 2  . CATARACT EXTRACTION     both eyes  . COLONOSCOPY WITH PROPOFOL N/A 07/08/2014   Procedure: COLONOSCOPY WITH PROPOFOL;  Surgeon: Garlan Fair, MD;  Location: [facility];  Service: Endoscopy;  Laterality: N/A;  . COLPOSCOPY     2015, ECC neg, polyp neg, 2016 & 2017  . Thedora Hinders w/graft Right 03/10/10   right foot  . FOOT SURGERY Right 2000, [id number]  . FOOT SURGERY Left 2002, 2006, 2010  . Hammer toe repair Right 03/10/10   right foot # 5  . Hammer toe repair Left 03/02/11   left foot 3 . 5  . MANDIBLE FRACTURE SURGERY    . TUBAL LIGATION      Current Outpatient Medications  Medication Sig Dispense Refill  . CALCIUM  PO Take by mouth daily.    . Cholecalciferol (VITAMIN D PO) Take 2,000 Int'l Units by mouth daily.    . Estradiol 10 MCG TABS vaginal tablet Place 1 tablet (10 mcg total) vaginally 2 (two) times a week. 12 tablet 2  . lisinopril (PRINIVIL,ZESTRIL) 10 MG tablet Take 10 mg by mouth every morning.     . Multiple Vitamin (MULTIVITAMIN) tablet Take 1 tablet by mouth daily.    . NONFORMULARY OR COMPOUNDED ITEM Vaginal 5FU (5% concentration, 1gm once weekly (at night) x 8 weeks). Patient will need vaginal applicators. Dispense 8 grams 0RF 1 each 0  . Vitamin D, Ergocalciferol, (DRISDOL) 50000 UNITS CAPS Take 50,000 Units by mouth every 30 (thirty) days.     No current facility-administered medications for this visit.      ALLERGIES: Penicillins  Family History  Problem Relation Age of Onset  . Hypertension Sister   . Hypertension Brother   . Hypertension Mother     Social History   Socioeconomic History  .  Marital status: Divorced    Spouse name: Not on file  . Number of children: Not on file  . Years of education: Not on file  . Highest education level: Not on file  Occupational History  . Not on file  Social Needs  . Financial resource strain: Not on file  . Food insecurity:    Worry: Not on file    Inability: Not on file  . Transportation needs:    Medical: Not on file    Non-medical: Not on file  Tobacco Use  . Smoking status: Never Smoker  . Smokeless tobacco: Never Used  Substance and Sexual Activity  . Alcohol use: No  . Drug use: No  . Sexual activity: Never    Birth control/protection: Surgical    Comment: BTL  Lifestyle  . Physical activity:    Days per week: Not on file    Minutes per session: Not on file  . Stress: Not on file  Relationships  . Social connections:    Talks on phone: Not on file    Gets together: Not on file    Attends religious service: Not on file    Active member of club or organization: Not on file    Attends meetings of clubs or  organizations: Not on file    Relationship status: Not on file  . Intimate partner violence:    Fear of current or ex partner: Not on file    Emotionally abused: Not on file    Physically abused: Not on file    Forced sexual activity: Not on file  Other Topics Concern  . Not on file  Social History Narrative  . Not on file    Review of Systems  Constitutional: Negative.   HENT: Negative.   Eyes: Negative.   Respiratory: Negative.   Cardiovascular: Negative.   Gastrointestinal: Negative.   Genitourinary: Negative.   Skin: Negative.   Neurological: Negative.   Endo/Heme/Allergies: Negative.   Psychiatric/Behavioral: Negative.     PHYSICAL EXAMINATION:    BP 124/70 (BP Location: Right Arm, Patient Position: Sitting, Cuff Size: Normal)   Pulse 80   Wt 110 lb (49.9 kg)   LMP 03/22/2003   BMI 19.49 kg/m     General appearance: alert, cooperative and appears stated age  Pelvic: External genitalia:  no lesions              Urethra:  normal appearing urethra with no masses, tenderness or lesions              Bartholins and Skenes: normal                 Vagina: atrophic, shortened vagina, fused in front of the cervix              Cervix: unable to see the cervix, vagina fused in front of it              Bimanual Exam:  Uterus:  RV uterus, not palpated              Adnexa: no mass, fullness, tenderness               Chaperone was present for exam.  ASSESSMENT Vaginal dysplasia Vaginal atrophy Unable to evaluate her cervix, vagina fused in front of it. No help with estrogen tablets (so far)    PLAN Pap from vagina Will ultimately need hysterectomy, will make further plans after her pap returns.    An After Visit  Summary was printed and given to the patient.

## 2017-06-20 LAB — CYTOLOGY - PAP
Diagnosis: HIGH — AB
HPV: DETECTED — AB

## 2017-06-21 ENCOUNTER — Telehealth: Payer: Self-pay | Admitting: *Deleted

## 2017-06-21 NOTE — Telephone Encounter (Signed)
Spoke with patient, advised as seen below per Dr. Talbert Nan. Advised patient will coordinate appointment with Dr. Denman George and return call. Patient verbalizes understanding and is agreeable to plan.

## 2017-06-21 NOTE — Telephone Encounter (Signed)
See second telephone encounter dated 06/21/17.

## 2017-06-21 NOTE — Telephone Encounter (Signed)
Notes recorded by Burnice Logan, RN on 06/21/2017 at 1:52 PM EDT Left message to call Sharee Pimple at [phone].

## 2017-06-21 NOTE — Telephone Encounter (Signed)
-----   Message from Salvadore Dom, MD sent at 06/20/2017  5:19 PM EDT ----- Please let the patient know that her pap returned with dysplasia. I think she needs referral back to Dr Denman George for evaluation. She may need a partial vaginectomy with her hysterectomy. Please set up a f/u appointment/colposcopy with Dr Denman George CC: Dr Denman George

## 2017-06-21 NOTE — Telephone Encounter (Signed)
Please see the results note from yesterday

## 2017-06-21 NOTE — Telephone Encounter (Signed)
Renee Watkins calling from [facility] to report 06/15/17 abnormal pap: High Grade intraepithelial Lesion: VAIN 2/ VAIN 3/CIS. Results available in Epic. Advised results have been reviewed by provider.   Routing to Dr. Talbert Nan, Will close encounter.

## 2017-06-21 NOTE — Telephone Encounter (Signed)
Left message to return call to [place] at [facility]. Patient needs to schedule with Dr. Denman George for further evaluation of dysplasia on recent pap.

## 2017-06-22 ENCOUNTER — Ambulatory Visit: Payer: 59 | Admitting: Obstetrics and Gynecology

## 2017-06-23 ENCOUNTER — Telehealth: Payer: Self-pay | Admitting: *Deleted

## 2017-06-23 NOTE — Telephone Encounter (Signed)
Called and left the patient a message to call the office back. Patient needs an appt to see Dr. Denman George.

## 2017-06-23 NOTE — Telephone Encounter (Signed)
Call from Arma at [facility] Discussed referral info. She will review with Dr Denman George and call back with appointment.

## 2017-06-26 ENCOUNTER — Telehealth: Payer: Self-pay | Admitting: *Deleted

## 2017-06-26 NOTE — Telephone Encounter (Signed)
Returned the patient's call and left a message to call the office back 

## 2017-06-27 ENCOUNTER — Telehealth: Payer: Self-pay | Admitting: *Deleted

## 2017-06-27 NOTE — Telephone Encounter (Signed)
Left message for Sharyn Lull at [facility], calling to f/u in regard to appointment for further evaluation with Dr. Denman George. Please call Sharee Pimple, RN at [facility].

## 2017-06-27 NOTE — Telephone Encounter (Signed)
Renee Watkins from GYN Oncology called and left a message she has tried to reach the patient to no avail. She said they are tentatively holding a slot for her on 06/27/17 at 12:00 PM

## 2017-06-27 NOTE — Telephone Encounter (Signed)
Called, left the patient a message both yesterday and today to call the office

## 2017-06-28 ENCOUNTER — Telehealth: Payer: Self-pay | Admitting: *Deleted

## 2017-06-28 NOTE — Telephone Encounter (Signed)
Called and left the patient a message to call the office back. Left a message that we can see her on April 19th at 12pm, ask that she calls and to let us know if the appt works.

## 2017-06-29 NOTE — Telephone Encounter (Signed)
Spoke with patient. Patient states she returned call to [facility] at Dr. Serita Grit office, is scheduled for 07/07/17 at 12pm with Dr. Denman George. Patient verbalizes understanding and is agreeable.    Call returned to [facility] at [facility], confirmed appointment for 07/07/17 at 12pm with Dr. Denman George.    Routing to provider for final review. Patient is agreeable to disposition. Will close encounter.   CcKandace Blitz, RN

## 2017-06-29 NOTE — Telephone Encounter (Signed)
Call to patient. Per DPR, can leave message on voice mail.  Left message that we are calling to assist in scheduling appointment with Dr Serita Grit office and they have been unable to reach her. Left message to call back to Gay Filler or Sharee Pimple at [facility] or back to Dr Serita Grit office to schedule appointment at [phone].

## 2017-07-07 ENCOUNTER — Encounter: Payer: Self-pay | Admitting: Gynecologic Oncology

## 2017-07-07 ENCOUNTER — Other Ambulatory Visit: Payer: Self-pay | Admitting: Endocrinology

## 2017-07-07 ENCOUNTER — Inpatient Hospital Stay: Payer: Medicare HMO | Attending: Gynecologic Oncology | Admitting: Gynecologic Oncology

## 2017-07-07 VITALS — BP 139/58 | HR 85 | Temp 98.0°F | Resp 16 | Ht 63.0 in | Wt 108.6 lb

## 2017-07-07 DIAGNOSIS — N893 Dysplasia of vagina, unspecified: Secondary | ICD-10-CM

## 2017-07-07 DIAGNOSIS — D069 Carcinoma in situ of cervix, unspecified: Secondary | ICD-10-CM

## 2017-07-07 DIAGNOSIS — M810 Age-related osteoporosis without current pathological fracture: Secondary | ICD-10-CM

## 2017-07-07 NOTE — Patient Instructions (Addendum)
Dr Denman George is recommending surgery with modified radical hysterectomy with upper vaginectomy. This will be done through small incisions with an overnight stay in the hospital.                 Preparing for your Surgery  Plan for surgery on Jul 31, 2017 with Dr. Everitt Amber at [place] will be scheduled for a modified radical robotic hysterectomy.  Pre-operative Testing -You will receive a phone call from presurgical testing at [facility] to arrange for a pre-operative testing appointment before your surgery.  This appointment normally occurs one to two weeks before your scheduled surgery.   -Bring your insurance card, copy of an advanced directive if applicable, medication list  -At that visit, you will be asked to sign a consent for a possible blood transfusion in case a transfusion becomes necessary during surgery.  The need for a blood transfusion is rare but having consent is a necessary part of your care.     -You should not be taking blood thinners or aspirin at least ten days prior to surgery unless instructed by your surgeon.  Day Before Surgery at [place] will be asked to take in a light diet the day before surgery.  Avoid carbonated beverages.  You will be advised to have nothing to eat or drink after midnight the evening before.    Eat a light diet the day before surgery.  Examples including soups, broths, toast, yogurt, mashed potatoes.  Things to avoid include carbonated beverages (fizzy beverages), raw fruits and raw vegetables, or beans.   If your bowels are filled with gas, your surgeon will have difficulty visualizing your pelvic organs which increases your surgical risks.  Your role in recovery Your role is to become active as soon as directed by your doctor, while still giving yourself time to heal.  Rest when you feel tired. You will be asked to do the following in order to speed your recovery:  - Cough and breathe deeply. This helps toclear  and expand your lungs and can prevent pneumonia. You may be given a spirometer to practice deep breathing. A staff member will show you how to use the spirometer. - Do mild physical activity. Walking or moving your legs help your circulation and body functions return to normal. A staff member will help you when you try to walk and will provide you with simple exercises. Do not try to get up or walk alone the first time. - Actively manage your pain. Managing your pain lets you move in comfort. We will ask you to rate your pain on a scale of zero to 10. It is your responsibility to tell your doctor or nurse where and how much you hurt so your pain can be treated.  Special Considerations -If you are diabetic, you may be placed on insulin after surgery to have closer control over your blood sugars to promote healing and recovery.  This does not mean that you will be discharged on insulin.  If applicable, your oral antidiabetics will be resumed when you are tolerating a solid diet.  -Your final pathology results from surgery should be available by the Friday after surgery and the results will be relayed to you when available.  -Dr. Lahoma Crocker is the Surgeon that assists your GYN Oncologist with surgery.  The next day after your surgery you will either see your GYN Oncologist or Dr. Lahoma Crocker.   Blood Transfusion Information WHAT IS A  BLOOD TRANSFUSION? A transfusion is the replacement of blood or some of its parts. Blood is made up of multiple cells which provide different functions.  Red blood cells carry oxygen and are used for blood loss replacement.  White blood cells fight against infection.  Platelets control bleeding.  Plasma helps clot blood.  Other blood products are available for specialized needs, such as hemophilia or other clotting disorders. BEFORE THE TRANSFUSION  Who gives blood for transfusions?   You may be able to donate blood to be used at a later date on  yourself (autologous donation).  Relatives can be asked to donate blood. This is generally not any safer than if you have received blood from a stranger. The same precautions are taken to ensure safety when a relative's blood is donated.  Healthy volunteers who are fully evaluated to make sure their blood is safe. This is blood bank blood. Transfusion therapy is the safest it has ever been in the practice of medicine. Before blood is taken from a donor, a complete history is taken to make sure that person has no history of diseases nor engages in risky social behavior (examples are intravenous drug use or sexual activity with multiple partners). The donor's travel history is screened to minimize risk of transmitting infections, such as malaria. The donated blood is tested for signs of infectious diseases, such as HIV and hepatitis. The blood is then tested to be sure it is compatible with you in order to minimize the chance of a transfusion reaction. If you or a relative donates blood, this is often done in anticipation of surgery and is not appropriate for emergency situations. It takes many days to process the donated blood. RISKS AND COMPLICATIONS Although transfusion therapy is very safe and saves many lives, the main dangers of transfusion include:   Getting an infectious disease.  Developing a transfusion reaction. This is an allergic reaction to something in the blood you were given. Every precaution is taken to prevent this. The decision to have a blood transfusion has been considered carefully by your caregiver before blood is given. Blood is not given unless the benefits outweigh the risks.

## 2017-07-07 NOTE — Progress Notes (Signed)
Consult Note: Gyn-Onc  Consult was requested by Dr. Talbert Nan for the evaluation of Cynethia Schindler 72 y.o. female  CC:  Chief Complaint  Patient presents with  . Vaginal dysplasia    Assessment/Plan:  Ms. Dakotah Heiman  is a 72 y.o.  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 Failed therapy with efudex. Given that we cannot evaulate the upper vagina and cervix, I believe it is reasonable to proceed with surgery to remove the cervix and upper vagina for diagnostic purposes. I extensively counseled Sacheen that this surgery would not cure or treat her dysplasia or HPV, and that she would almost certainly continue to have abnormal pap smears that would require treatment with Dr Talbert Nan, however, this surgery would eliminate the uncertainty of incomplete upper vaginal evaluation. It was unclear to me that [place]a fully understands this despite extensive counseling.  I discussed that upper vaginectomy requires a more extensive dissection that hysterectomy (a modified radical approach) which places her at increased risk for injury to bladder and ureters. I discussed other surgical risks including  bleeding, infection, damage to internal organs (such as bladder,ureters, bowels), blood clot, reoperation and rehospitalization.  HPI: Emiliana Blaize is a 72 year old woman who is seen in consultation at the request of Dr Talbert Nan for vaginal dysplasia and agglutination.  The patient has a long standing history of abnormal paps and high risk HPV.  She had a pap in August 2016 which showed ASCUS with high risk HPV positive. Cytology was negaive in 2017 but high risk HPV remained present.  On 11/08/16 she underwent a pap which showed ASC-H with positive high risk HPV. Colposcopy of the vagina on 11/23/16 showed an agglutinated upper vagina and the cervix could not be visualized. However, there was an area of increased pigmentation of the vagina just under the fusion at 6 o'clock and a  biopsy was taken. There was decreased lugols staining throughout the vagina and a biopsy was taken at 12 o'clock.  These biopsies revealed "CIN 2-3".  A TVUS was obtained on 12/20/16 to identify the cervix as none could be seen on exam. The US showed a 6.8x3.6x3.2cm uterus with a thin endometrium. The cervix was grossly normal on [place].   Interval Hx:  She was treated with 5-FU vaginally for 2 months in December and January, 2019. She then underwent repeat pap on 06/15/17 which showed VAIN/CIN 2-3 with high risk HPV detected.   Current Meds:  Outpatient Encounter Medications as of 07/07/2017  Medication Sig  . CALCIUM PO Take by mouth daily.  . Cholecalciferol (VITAMIN D PO) Take 2,000 Int'l Units by mouth daily.  Marland Kitchen lisinopril (PRINIVIL,ZESTRIL) 10 MG tablet Take 10 mg by mouth every morning.   . Multiple Vitamin (MULTIVITAMIN) tablet Take 1 tablet by mouth daily.  . NONFORMULARY OR COMPOUNDED ITEM Vaginal 5FU (5% concentration, 1gm once weekly (at night) x 8 weeks). Patient will need vaginal applicators. Dispense 8 grams 0RF  . Vitamin D, Ergocalciferol, (DRISDOL) 50000 UNITS CAPS Take 50,000 Units by mouth every 30 (thirty) days.  . Estradiol 10 MCG TABS vaginal tablet Place 1 tablet (10 mcg total) vaginally 2 (two) times a week. (Patient not taking: Reported on 07/07/2017)   No facility-administered encounter medications on file as of 07/07/2017.     Allergy:  Allergies  Allergen Reactions  . Penicillins Anaphylaxis    Social Hx:   Social History   Socioeconomic History  . Marital status: Divorced    Spouse name:  Not on file  . Number of children: Not on file  . Years of education: Not on file  . Highest education level: Not on file  Occupational History  . Not on file  Social Needs  . Financial resource strain: Not on file  . Food insecurity:    Worry: Not on file    Inability: Not on file  . Transportation needs:    Medical: Not on file    Non-medical: Not on file   Tobacco Use  . Smoking status: Never Smoker  . Smokeless tobacco: Never Used  Substance and Sexual Activity  . Alcohol use: No  . Drug use: No  . Sexual activity: Never    Birth control/protection: Surgical    Comment: BTL  Lifestyle  . Physical activity:    Days per week: Not on file    Minutes per session: Not on file  . Stress: Not on file  Relationships  . Social connections:    Talks on phone: Not on file    Gets together: Not on file    Attends religious service: Not on file    Active member of club or organization: Not on file    Attends meetings of clubs or organizations: Not on file    Relationship status: Not on file  . Intimate partner violence:    Fear of current or ex partner: Not on file    Emotionally abused: Not on file    Physically abused: Not on file    Forced sexual activity: Not on file  Other Topics Concern  . Not on file  Social History Narrative  . Not on file    Past Surgical Hx:  Past Surgical History:  Procedure Laterality Date  . Base Wedge Osteomy Right 03/10/10   right foot  . BUNIONECTOMY Right 03/08/12   Keller right foot  . Capsulotomy IPJ Right 03/08/12   Right foot 2  . CATARACT EXTRACTION     both eyes  . COLONOSCOPY WITH PROPOFOL N/A 07/08/2014   Procedure: COLONOSCOPY WITH PROPOFOL;  Surgeon: Garlan Fair, MD;  Location: [facility];  Service: Endoscopy;  Laterality: N/A;  . COLPOSCOPY     2015, ECC neg, polyp neg, 2016 & 2017  . Thedora Hinders w/graft Right 03/10/10   right foot  . FOOT SURGERY Right 2000, [id number]  . FOOT SURGERY Left 2002, 2006, 2010  . Hammer toe repair Right 03/10/10   right foot # 5  . Hammer toe repair Left 03/02/11   left foot 3 . 5  . MANDIBLE FRACTURE SURGERY    . TUBAL LIGATION      Past Medical Hx:  Past Medical History:  Diagnosis Date  . Abnormal Pap smear of cervix 09/30/2013   neg pap HPV HR +, 8-10-16ASCUS HPV HR+, 8/17 neg HPV HR+  . Bilateral artificial lens implant   .  Hypertension   . Osteoporosis   . STD (sexually transmitted disease)    HSV    Past Gynecological History:  + high risk HPV Patient's last menstrual period was 03/22/2003.  Family Hx:  Family History  Problem Relation Age of Onset  . Hypertension Sister   . Hypertension Brother   . Hypertension Mother     Review of Systems:  Constitutional  Feels well,    ENT Normal appearing ears and nares bilaterally Skin/Breast  No rash, sores, jaundice, itching, dryness Cardiovascular  No chest pain, shortness of breath, or edema  Pulmonary  No cough or wheeze.  Gastro Intestinal  No nausea, vomitting, or diarrhoea. No bright red blood per rectum, no abdominal pain, change in bowel movement, or constipation.  Genito Urinary  No frequency, urgency, dysuria, no bleeding or discharge. Not sexually active Musculo Skeletal  No myalgia, arthralgia, joint swelling or pain  Neurologic  No weakness, numbness, change in gait,  Psychology  No depression, anxiety, insomnia.   Vitals:  Blood pressure (!) 139/58, pulse 85, temperature 98 F (36.7 C), temperature source Oral, resp. rate 16, height 5\' 3"  (1.6 m), weight 108 lb 9.6 oz (49.3 kg), last menstrual period 03/22/2003, SpO2 100 %.  Physical Exam: WD in NAD Neck  Supple NROM, without any enlargements.  Lymph Node Survey No cervical supraclavicular or inguinal adenopathy Cardiovascular  Pulse normal rate, regularity and rhythm. S1 and S2 normal.  Lungs  Clear to auscultation bilateraly, without wheezes/crackles/rhonchi. Good air movement.  Skin  No rash/lesions/breakdown  Psychiatry  Alert and oriented to person, place, and time  Abdomen  Normoactive bowel sounds, abdomen soft, non-tender and very thin without evidence of hernia.  Back No CVA tenderness Genito Urinary  Vulva/vagina: Normal external female genitalia.   No lesions. No discharge or bleeding.  Bladder/urethra:  No lesions or masses, well supported  bladder  Vagina: atrophic, no gross lesions seen suggestive of invasive carcinoma, and no palpable masses. The biopsy sites are seen. There is agglutination of the upper vagina such that the cervix cannot be visualized.   Cervix: unable to be seen but palpably normal on rectovaginal exam.  Uterus:  Small, mobile, no parametrial involvement or nodularity on RV exam  Adnexa: no masses. Rectal  Good tone, no masses no cul de sac nodularity.  Extremities  No bilateral cyanosis, clubbing or edema.   Thereasa Solo, MD  07/07/2017, 1:03 PM

## 2017-07-10 ENCOUNTER — Telehealth: Payer: Self-pay | Admitting: *Deleted

## 2017-07-10 NOTE — Telephone Encounter (Signed)
Called and left the patient a message to call the office. Need to ask patient if we can give information to her daughter Montel Clock [phone]).   Daughter called regarding information on  the procedure the patient is having done.

## 2017-07-11 NOTE — Telephone Encounter (Signed)
Per patient is it ok to release medical information to her daughter Montel Clock, and to add to the HIPPA form

## 2017-07-11 NOTE — Telephone Encounter (Signed)
LM for patient to call regarding speaking to a family member not listed on her HIPPA form.

## 2017-07-12 ENCOUNTER — Telehealth: Payer: Self-pay

## 2017-07-12 NOTE — Telephone Encounter (Signed)
Per patient is it ok to release medical information to her daughter Montel Clock, and to add to the HIPPA form.  Pt's daughter Mechele Claude called this morning and wanted better understanding of reason for surgery.  Explained to her per Dr Serita Grit notes regarding pt's dysplasia, pt's history of abnormal pap smears, surgery for diagnostic purposes, and higher risk for cancer.  Daughter voiced understanding and would like surgery date to be moved to last week of May so both daughters can help take care of pt at home.  Notified Joylene John NP and she changed date to May 28th.  Notified pt's daughter Mechele Claude and I called pt to tell her the date change but received her VM, left her a message to call to confirm she is aware of surgery date change.

## 2017-07-18 DIAGNOSIS — M810 Age-related osteoporosis without current pathological fracture: Secondary | ICD-10-CM | POA: Diagnosis not present

## 2017-07-19 DIAGNOSIS — M810 Age-related osteoporosis without current pathological fracture: Secondary | ICD-10-CM | POA: Diagnosis not present

## 2017-08-02 ENCOUNTER — Telehealth: Payer: Self-pay

## 2017-08-02 NOTE — Telephone Encounter (Signed)
Reviewed Pre op and post op appointments and post op needs with in the first 24 to 48 hrs after she is discharged.with daughter as she is trying to plan her time office from her employer to be available to help mother. Daughter appreciative for the information.

## 2017-08-02 NOTE — Telephone Encounter (Signed)
ENCOUNTER IN ERROR

## 2017-08-04 NOTE — Patient Instructions (Signed)
Renee Watkins  08/04/2017   Your procedure is scheduled on: 08-15-17   Report to [facility] Main  Entrance    Report to admitting at 7:30AM    Call this number if you have problems the morning of surgery [phone]     Remember: Eat a light diet the day before surgery. Examples including soups, broths, toast, yogurt, mashed potatoes. Things to avoid include carbonated beverages (fizzy beverages), raw fruits and raw vegetables, or beans.  Drink only clear liquids after midnight until  3 hours prior to scheduled surgery. Please finish carbohydrate drink 3 hours prior to surgery at [place] Excluded  Coffee and tea, regular and decaf                             liquids that you cannot  Plain Jell-O in any flavor                                             see through such as: Fruit ices (not with fruit pulp)                                     milk, soups, orange juice  Iced Popsicles                                    All solid food Carbonated beverages, regular and diet                                    Cranberry, grape and apple juices Sports drinks like Gatorade Lightly seasoned clear broth or consume(fat free) Sugar, honey syrup  Sample Menu Breakfast                                Lunch                                     Supper Cranberry juice                    Beef broth                            Chicken broth Jell-O  Grape juice                           Apple juice Coffee or tea                        Jell-O                                      Popsicle                                                Coffee or tea                        Coffee or tea  _____________________________________________________________________       Take these medicines the morning of surgery with A SIP OF WATER: none                                  You may not have any metal on your body including hair pins and              piercings  Do not wear jewelry, make-up, lotions, powders or perfumes, deodorant             Do not wear nail polish.  Do not shave  48 hours prior to surgery.                 Do not bring valuables to the hospital. [place].  Contacts, dentures or bridgework may not be worn into surgery.  Leave suitcase in the car. After surgery it may be brought to your room.                 Please read over the following fact sheets you were given: _____________________________________________________________________             [facility] - Preparing for Surgery Before surgery, you can play an important role.  Because skin is not sterile, your skin needs to be as free of germs as possible.  You can reduce the number of germs on your skin by washing with CHG (chlorahexidine gluconate) soap before surgery.  CHG is an antiseptic cleaner which kills germs and bonds with the skin to continue killing germs even after washing. Please DO NOT use if you have an allergy to CHG or antibacterial soaps.  If your skin becomes reddened/irritated stop using the CHG and inform your nurse when you arrive at Short Stay. Do not shave (including legs and underarms) for at least 48 hours prior to the first CHG shower.  You may shave your face/neck. Please follow these instructions carefully:  1.  Shower with CHG Soap the night before surgery and the  morning of Surgery.  2.  If you choose to wash your hair, wash your hair first as usual with your  normal  shampoo.  3.  After you shampoo, rinse your hair and body thoroughly to remove the  shampoo.  4.  Use CHG as you would any other liquid soap.  You can apply chg directly  to the skin and wash                       Gently with a scrungie or clean washcloth.  5.  Apply the CHG Soap to your  body ONLY FROM THE NECK DOWN.   Do not use on face/ open                           Wound or open sores. Avoid contact with eyes, ears mouth and genitals (private parts).                       Wash face,  Genitals (private parts) with your normal soap.             6.  Wash thoroughly, paying special attention to the area where your surgery  will be performed.  7.  Thoroughly rinse your body with warm water from the neck down.  8.  DO NOT shower/wash with your normal soap after using and rinsing off  the CHG Soap.                9.  Pat yourself dry with a clean towel.            10.  Wear clean pajamas.            11.  Place clean sheets on your bed the night of your first shower and do not  sleep with pets. Day of Surgery : Do not apply any lotions/deodorants the morning of surgery.  Please wear clean clothes to the hospital/surgery center.  FAILURE TO FOLLOW THESE INSTRUCTIONS MAY RESULT IN THE CANCELLATION OF YOUR SURGERY PATIENT SIGNATURE_________________________________  NURSE SIGNATURE__________________________________  ________________________________________________________________________   Adam Phenix  An incentive spirometer is a tool that can help keep your lungs clear and active. This tool measures how well you are filling your lungs with each breath. Taking long deep breaths may help reverse or decrease the chance of developing breathing (pulmonary) problems (especially infection) following:  A long period of time when you are unable to move or be active. BEFORE THE PROCEDURE   If the spirometer includes an indicator to show your best effort, your nurse or respiratory therapist will set it to a desired goal.  If possible, sit up straight or lean slightly forward. Try not to slouch.  Hold the incentive spirometer in an upright position. INSTRUCTIONS FOR USE  1. Sit on the edge of your bed if possible, or sit up as far as you can in bed or on a chair. 2. Hold the  incentive spirometer in an upright position. 3. Breathe out normally. 4. Place the mouthpiece in your mouth and seal your lips tightly around it. 5. Breathe in slowly and as deeply as possible, raising the piston or the ball toward the top of the column. 6. Hold your breath for 3-5 seconds or for as long as possible. Allow the piston or ball to fall to the bottom of the column. 7. Remove the mouthpiece from your mouth and breathe out normally. 8. Rest for a few seconds and repeat Steps 1 through 7 at least 10 times every 1-2 hours when you are awake. Take your time and take a few normal breaths between deep breaths. 9. The spirometer may include an indicator to  show your best effort. Use the indicator as a goal to work toward during each repetition. 10. After each set of 10 deep breaths, practice coughing to be sure your lungs are clear. If you have an incision (the cut made at the time of surgery), support your incision when coughing by placing a pillow or rolled up towels firmly against it. Once you are able to get out of bed, walk around indoors and cough well. You may stop using the incentive spirometer when instructed by your caregiver.  RISKS AND COMPLICATIONS  Take your time so you do not get dizzy or light-headed.  If you are in pain, you may need to take or ask for pain medication before doing incentive spirometry. It is harder to take a deep breath if you are having pain. AFTER USE  Rest and breathe slowly and easily.  It can be helpful to keep track of a log of your progress. Your caregiver can provide you with a simple table to help with this. If you are using the spirometer at home, follow these instructions: [place] IF:   You are having difficultly using the spirometer.  You have trouble using the spirometer as often as instructed.  Your pain medication is not giving enough relief while using the spirometer.  You develop fever of 100.5 F (38.1 C) or  higher. SEEK IMMEDIATE MEDICAL CARE IF:   You cough up bloody sputum that had not been present before.  You develop fever of 102 F (38.9 C) or greater.  You develop worsening pain at or near the incision site. MAKE SURE YOU:   Understand these instructions.  Will watch your condition.  Will get help right away if you are not doing well or get worse. Document Released: 07/18/2006 Document Revised: 05/30/2011 Document Reviewed: 09/18/2006 ExitCare Patient Information 2014 ExitCare, [place].   ________________________________________________________________________  WHAT IS A BLOOD TRANSFUSION? Blood Transfusion Information  A transfusion is the replacement of blood or some of its parts. Blood is made up of multiple cells which provide different functions.  Red blood cells carry oxygen and are used for blood loss replacement.  White blood cells fight against infection.  Platelets control bleeding.  Plasma helps clot blood.  Other blood products are available for specialized needs, such as hemophilia or other clotting disorders. BEFORE THE TRANSFUSION  Who gives blood for transfusions?   Healthy volunteers who are fully evaluated to make sure their blood is safe. This is blood bank blood. Transfusion therapy is the safest it has ever been in the practice of medicine. Before blood is taken from a donor, a complete history is taken to make sure that person has no history of diseases nor engages in risky social behavior (examples are intravenous drug use or sexual activity with multiple partners). The donor's travel history is screened to minimize risk of transmitting infections, such as malaria. The donated blood is tested for signs of infectious diseases, such as HIV and hepatitis. The blood is then tested to be sure it is compatible with you in order to minimize the chance of a transfusion reaction. If you or a relative donates blood, this is often done in anticipation of surgery  and is not appropriate for emergency situations. It takes many days to process the donated blood. RISKS AND COMPLICATIONS Although transfusion therapy is very safe and saves many lives, the main dangers of transfusion include:   Getting an infectious disease.  Developing a transfusion reaction. This is an allergic reaction  to something in the blood you were given. Every precaution is taken to prevent this. The decision to have a blood transfusion has been considered carefully by your caregiver before blood is given. Blood is not given unless the benefits outweigh the risks. AFTER THE TRANSFUSION  Right after receiving a blood transfusion, you will usually feel much better and more energetic. This is especially true if your red blood cells have gotten low (anemic). The transfusion raises the level of the red blood cells which carry oxygen, and this usually causes an energy increase.  The nurse administering the transfusion will monitor you carefully for complications. HOME CARE INSTRUCTIONS  No special instructions are needed after a transfusion. You may find your energy is better. Speak with your caregiver about any limitations on activity for underlying diseases you may have. SEEK MEDICAL CARE IF:   Your condition is not improving after your transfusion.  You develop redness or irritation at the intravenous (IV) site. SEEK IMMEDIATE MEDICAL CARE IF:  Any of the following symptoms occur over the next 12 hours:  Shaking chills.  You have a temperature by mouth above 102 F (38.9 C), not controlled by medicine.  Chest, back, or muscle pain.  People around you feel you are not acting correctly or are confused.  Shortness of breath or difficulty breathing.  Dizziness and fainting.  You get a rash or develop hives.  You have a decrease in urine output.  Your urine turns a dark color or changes to pink, red, or brown. Any of the following symptoms occur over the next 10  days:  You have a temperature by mouth above 102 F (38.9 C), not controlled by medicine.  Shortness of breath.  Weakness after normal activity.  The white part of the eye turns yellow (jaundice).  You have a decrease in the amount of urine or are urinating less often.  Your urine turns a dark color or changes to pink, red, or brown. Document Released: 03/04/2000 Document Revised: 05/30/2011 Document Reviewed: 10/22/2007 [facility] Patient Information 2014 [place], [place].  _______________________________________________________________________

## 2017-08-07 ENCOUNTER — Encounter (HOSPITAL_COMMUNITY): Payer: Self-pay

## 2017-08-07 ENCOUNTER — Encounter (HOSPITAL_COMMUNITY)
Admission: RE | Admit: 2017-08-07 | Discharge: 2017-08-07 | Disposition: A | Discharge status: Home / Self Care | Payer: Medicare HMO | Source: Ambulatory Visit | Attending: Gynecologic Oncology | Admitting: Gynecologic Oncology

## 2017-08-07 ENCOUNTER — Other Ambulatory Visit: Payer: Self-pay

## 2017-08-07 DIAGNOSIS — I10 Essential (primary) hypertension: Secondary | ICD-10-CM | POA: Insufficient documentation

## 2017-08-07 DIAGNOSIS — Z01812 Encounter for preprocedural laboratory examination: Secondary | ICD-10-CM | POA: Insufficient documentation

## 2017-08-07 DIAGNOSIS — Z01810 Encounter for preprocedural cardiovascular examination: Secondary | ICD-10-CM | POA: Insufficient documentation

## 2017-08-07 LAB — CBC
HCT: 39.6 % (ref 36.0–46.0)
Hemoglobin: 13 g/dL (ref 12.0–15.0)
MCH: 30.6 pg (ref 26.0–34.0)
MCHC: 32.8 g/dL (ref 30.0–36.0)
MCV: 93.2 fL (ref 78.0–100.0)
Platelets: 391 10*3/uL (ref 150–400)
RBC: 4.25 MIL/uL (ref 3.87–5.11)
RDW: 13.1 % (ref 11.5–15.5)
WBC: 4.2 10*3/uL (ref 4.0–10.5)

## 2017-08-07 LAB — COMPREHENSIVE METABOLIC PANEL
ALT: 23 U/L (ref 14–54)
ANION GAP: 9 (ref 5–15)
AST: 25 U/L (ref 15–41)
Albumin: 4 g/dL (ref 3.5–5.0)
Alkaline Phosphatase: 54 U/L (ref 38–126)
BUN: 17 mg/dL (ref 6–20)
CALCIUM: 9.4 mg/dL (ref 8.9–10.3)
CHLORIDE: 106 mmol/L (ref 101–111)
CO2: 27 mmol/L (ref 22–32)
CREATININE: 0.57 mg/dL (ref 0.44–1.00)
Glucose, Bld: 111 mg/dL — ABNORMAL HIGH (ref 65–99)
Potassium: 4.5 mmol/L (ref 3.5–5.1)
Sodium: 142 mmol/L (ref 135–145)
Total Bilirubin: 0.8 mg/dL (ref 0.3–1.2)
Total Protein: 7 g/dL (ref 6.5–8.1)

## 2017-08-07 LAB — ABO/RH: ABO/RH(D): B POS

## 2017-08-11 ENCOUNTER — Telehealth: Payer: Self-pay | Admitting: *Deleted

## 2017-08-11 NOTE — Telephone Encounter (Signed)
Patient called and left a message regarding if she needs a down payment for her surgery. I have called and left the patient a message to call pre-surgical [phone].

## 2017-08-15 ENCOUNTER — Telehealth: Payer: Self-pay | Admitting: *Deleted

## 2017-08-15 ENCOUNTER — Other Ambulatory Visit: Payer: Self-pay

## 2017-08-15 ENCOUNTER — Encounter (HOSPITAL_COMMUNITY): Payer: Self-pay | Admitting: Anesthesiology

## 2017-08-15 ENCOUNTER — Ambulatory Visit (HOSPITAL_COMMUNITY): Payer: Medicare HMO | Admitting: Anesthesiology

## 2017-08-15 ENCOUNTER — Encounter (HOSPITAL_COMMUNITY)
Admission: RE | Disposition: A | Discharge status: Home / Self Care | Payer: Self-pay | Source: Ambulatory Visit | Attending: Gynecologic Oncology

## 2017-08-15 ENCOUNTER — Ambulatory Visit (HOSPITAL_COMMUNITY)
Admission: RE | Admit: 2017-08-15 | Discharge: 2017-08-16 | Disposition: A | Discharge status: Home / Self Care | Payer: Medicare HMO | Source: Ambulatory Visit | Attending: Gynecologic Oncology | Admitting: Gynecologic Oncology

## 2017-08-15 DIAGNOSIS — Z880 Allergy status to penicillin: Secondary | ICD-10-CM | POA: Insufficient documentation

## 2017-08-15 DIAGNOSIS — Z79899 Other long term (current) drug therapy: Secondary | ICD-10-CM | POA: Insufficient documentation

## 2017-08-15 DIAGNOSIS — D069 Carcinoma in situ of cervix, unspecified: Secondary | ICD-10-CM | POA: Diagnosis not present

## 2017-08-15 DIAGNOSIS — D072 Carcinoma in situ of vagina: Secondary | ICD-10-CM | POA: Diagnosis not present

## 2017-08-15 DIAGNOSIS — D259 Leiomyoma of uterus, unspecified: Secondary | ICD-10-CM | POA: Insufficient documentation

## 2017-08-15 DIAGNOSIS — N879 Dysplasia of cervix uteri, unspecified: Secondary | ICD-10-CM

## 2017-08-15 DIAGNOSIS — I10 Essential (primary) hypertension: Secondary | ICD-10-CM | POA: Diagnosis not present

## 2017-08-15 HISTORY — PX: ROBOTIC ASSISTED TOTAL HYSTERECTOMY: SHX6085

## 2017-08-15 LAB — TYPE AND SCREEN
ABO/RH(D): B POS
ANTIBODY SCREEN: NEGATIVE

## 2017-08-15 SURGERY — HYSTERECTOMY, TOTAL, ROBOT-ASSISTED
Anesthesia: General

## 2017-08-15 MED ORDER — ONDANSETRON HCL 4 MG/2ML IJ SOLN: 4.0000 mg | Freq: Four times a day (QID) | INTRAMUSCULAR | Status: DC | PRN

## 2017-08-15 MED ORDER — TRAMADOL HCL 50 MG PO TABS
100.0000 mg | ORAL_TABLET | Freq: Two times a day (BID) | ORAL | Status: DC | PRN
  Filled 2017-08-15: qty 2

## 2017-08-15 MED ORDER — PROPOFOL 10 MG/ML IV BOLUS
INTRAVENOUS | Status: AC
  Filled 2017-08-15: qty 20

## 2017-08-15 MED ORDER — ROCURONIUM BROMIDE 10 MG/ML (PF) SYRINGE
PREFILLED_SYRINGE | INTRAVENOUS | Status: DC | PRN
  Administered 2017-08-15: 45 mg via INTRAVENOUS

## 2017-08-15 MED ORDER — ROCURONIUM BROMIDE 10 MG/ML (PF) SYRINGE
PREFILLED_SYRINGE | INTRAVENOUS | Status: AC
  Filled 2017-08-15: qty 15

## 2017-08-15 MED ORDER — PROPOFOL 10 MG/ML IV BOLUS
INTRAVENOUS | Status: DC | PRN
  Administered 2017-08-15: 100 mg via INTRAVENOUS

## 2017-08-15 MED ORDER — ONDANSETRON HCL 4 MG/2ML IJ SOLN
INTRAMUSCULAR | Status: AC
  Filled 2017-08-15: qty 4

## 2017-08-15 MED ORDER — OXYCODONE HCL 5 MG PO TABS: 5.0000 mg | ORAL_TABLET | Freq: Once | ORAL | Status: DC | PRN

## 2017-08-15 MED ORDER — OXYCODONE HCL 5 MG/5ML PO SOLN
5.0000 mg | Freq: Once | ORAL | Status: DC | PRN
  Filled 2017-08-15: qty 5

## 2017-08-15 MED ORDER — GABAPENTIN 300 MG PO CAPS
300.0000 mg | ORAL_CAPSULE | ORAL | Status: AC
  Administered 2017-08-15: 300 mg via ORAL
  Filled 2017-08-15: qty 1

## 2017-08-15 MED ORDER — ACETAMINOPHEN 500 MG PO TABS
1000.0000 mg | ORAL_TABLET | Freq: Two times a day (BID) | ORAL | Status: DC
  Administered 2017-08-15 – 2017-08-16 (×2): 1000 mg via ORAL
  Filled 2017-08-15 (×2): qty 2

## 2017-08-15 MED ORDER — STERILE WATER FOR IRRIGATION IR SOLN
Status: DC | PRN
  Administered 2017-08-15: 1000 mL

## 2017-08-15 MED ORDER — SUGAMMADEX SODIUM 200 MG/2ML IV SOLN
INTRAVENOUS | Status: DC | PRN
  Administered 2017-08-15: 150 mg via INTRAVENOUS

## 2017-08-15 MED ORDER — MIDAZOLAM HCL 2 MG/2ML IJ SOLN
INTRAMUSCULAR | Status: AC
  Filled 2017-08-15: qty 2

## 2017-08-15 MED ORDER — FENTANYL CITRATE (PF) 250 MCG/5ML IJ SOLN
INTRAMUSCULAR | Status: DC | PRN
  Administered 2017-08-15 (×2): 50 ug via INTRAVENOUS
  Administered 2017-08-15: 75 ug via INTRAVENOUS
  Administered 2017-08-15: 25 ug via INTRAVENOUS
  Administered 2017-08-15: 50 ug via INTRAVENOUS

## 2017-08-15 MED ORDER — DEXAMETHASONE SODIUM PHOSPHATE 10 MG/ML IJ SOLN
INTRAMUSCULAR | Status: AC
  Filled 2017-08-15: qty 2

## 2017-08-15 MED ORDER — OXYCODONE HCL 5 MG PO TABS: 5.0000 mg | ORAL_TABLET | ORAL | Status: DC | PRN

## 2017-08-15 MED ORDER — LACTATED RINGERS IV SOLN
INTRAVENOUS | Status: DC
  Administered 2017-08-15 (×3): via INTRAVENOUS

## 2017-08-15 MED ORDER — LACTATED RINGERS IV SOLN: INTRAVENOUS | Status: DC

## 2017-08-15 MED ORDER — SUCCINYLCHOLINE CHLORIDE 200 MG/10ML IV SOSY
PREFILLED_SYRINGE | INTRAVENOUS | Status: AC
  Filled 2017-08-15: qty 20

## 2017-08-15 MED ORDER — IBUPROFEN 200 MG PO TABS
600.0000 mg | ORAL_TABLET | Freq: Four times a day (QID) | ORAL | Status: DC
  Administered 2017-08-16: 600 mg via ORAL
  Filled 2017-08-15: qty 3

## 2017-08-15 MED ORDER — LACTATED RINGERS IR SOLN
Status: DC | PRN
  Administered 2017-08-15: 1000 mL

## 2017-08-15 MED ORDER — ENOXAPARIN SODIUM 40 MG/0.4ML ~~LOC~~ SOLN
40.0000 mg | SUBCUTANEOUS | Status: DC
  Administered 2017-08-16: 40 mg via SUBCUTANEOUS
  Filled 2017-08-15: qty 0.4

## 2017-08-15 MED ORDER — ONDANSETRON HCL 4 MG/2ML IJ SOLN
INTRAMUSCULAR | Status: DC | PRN
  Administered 2017-08-15: 4 mg via INTRAVENOUS

## 2017-08-15 MED ORDER — DEXAMETHASONE SODIUM PHOSPHATE 4 MG/ML IJ SOLN: 4.0000 mg | INTRAMUSCULAR | Status: DC

## 2017-08-15 MED ORDER — SENNOSIDES-DOCUSATE SODIUM 8.6-50 MG PO TABS
2.0000 | ORAL_TABLET | Freq: Every day | ORAL | Status: DC
  Administered 2017-08-15: 2 via ORAL
  Filled 2017-08-15: qty 2

## 2017-08-15 MED ORDER — LIDOCAINE 2% (20 MG/ML) 5 ML SYRINGE
INTRAMUSCULAR | Status: DC | PRN
  Administered 2017-08-15: 50 mg via INTRAVENOUS

## 2017-08-15 MED ORDER — CELECOXIB 200 MG PO CAPS
400.0000 mg | ORAL_CAPSULE | ORAL | Status: AC
  Administered 2017-08-15: 400 mg via ORAL
  Filled 2017-08-15: qty 2

## 2017-08-15 MED ORDER — LISINOPRIL 10 MG PO TABS
10.0000 mg | ORAL_TABLET | Freq: Every morning | ORAL | Status: DC
Start: 2017-08-16 — End: 2017-08-16
  Administered 2017-08-16: 10 mg via ORAL
  Filled 2017-08-15: qty 1

## 2017-08-15 MED ORDER — PROMETHAZINE HCL 25 MG/ML IJ SOLN: 6.2500 mg | INTRAMUSCULAR | Status: DC | PRN

## 2017-08-15 MED ORDER — KCL IN DEXTROSE-NACL 20-5-0.45 MEQ/L-%-% IV SOLN
INTRAVENOUS | Status: DC
  Administered 2017-08-15: 17:00:00 via INTRAVENOUS
  Filled 2017-08-15: qty 1000

## 2017-08-15 MED ORDER — HYDROMORPHONE HCL 1 MG/ML IJ SOLN
0.2500 mg | INTRAMUSCULAR | Status: DC | PRN
  Administered 2017-08-15 (×2): 0.5 mg via INTRAVENOUS

## 2017-08-15 MED ORDER — ACETAMINOPHEN 500 MG PO TABS
1000.0000 mg | ORAL_TABLET | ORAL | Status: AC
  Administered 2017-08-15: 1000 mg via ORAL
  Filled 2017-08-15: qty 2

## 2017-08-15 MED ORDER — ONDANSETRON HCL 4 MG PO TABS: 4.0000 mg | ORAL_TABLET | Freq: Four times a day (QID) | ORAL | Status: DC | PRN

## 2017-08-15 MED ORDER — HYDROMORPHONE HCL 1 MG/ML IJ SOLN: 0.2000 mg | INTRAMUSCULAR | Status: DC | PRN

## 2017-08-15 MED ORDER — CIPROFLOXACIN IN D5W 400 MG/200ML IV SOLN
400.0000 mg | INTRAVENOUS | Status: AC
  Administered 2017-08-15: 400 mg via INTRAVENOUS
  Filled 2017-08-15: qty 200

## 2017-08-15 MED ORDER — DEXAMETHASONE SODIUM PHOSPHATE 10 MG/ML IJ SOLN
INTRAMUSCULAR | Status: DC | PRN
  Administered 2017-08-15: 4 mg via INTRAVENOUS

## 2017-08-15 MED ORDER — SCOPOLAMINE 1 MG/3DAYS TD PT72
1.0000 | MEDICATED_PATCH | TRANSDERMAL | Status: DC
  Administered 2017-08-15: 1.5 mg via TRANSDERMAL
  Filled 2017-08-15: qty 1

## 2017-08-15 MED ORDER — FENTANYL CITRATE (PF) 250 MCG/5ML IJ SOLN
INTRAMUSCULAR | Status: AC
  Filled 2017-08-15: qty 5

## 2017-08-15 MED ORDER — HYDROMORPHONE HCL 1 MG/ML IJ SOLN
INTRAMUSCULAR | Status: AC
  Filled 2017-08-15: qty 1

## 2017-08-15 MED ORDER — SUGAMMADEX SODIUM 200 MG/2ML IV SOLN
INTRAVENOUS | Status: AC
  Filled 2017-08-15: qty 4

## 2017-08-15 MED ORDER — MEPERIDINE HCL 50 MG/ML IJ SOLN: 6.2500 mg | INTRAMUSCULAR | Status: DC | PRN

## 2017-08-15 MED ORDER — CLINDAMYCIN PHOSPHATE 900 MG/50ML IV SOLN
900.0000 mg | INTRAVENOUS | Status: AC
  Administered 2017-08-15: 900 mg via INTRAVENOUS
  Filled 2017-08-15: qty 50

## 2017-08-15 MED ORDER — LIDOCAINE 2% (20 MG/ML) 5 ML SYRINGE
INTRAMUSCULAR | Status: AC
  Filled 2017-08-15: qty 10

## 2017-08-15 SURGICAL SUPPLY — 53 items
ADH SKN CLS APL DERMABOND .7 (GAUZE/BANDAGES/DRESSINGS) ×1
AGENT HMST KT MTR STRL THRMB (HEMOSTASIS)
APL ESCP 34 STRL LF DISP (HEMOSTASIS) ×1
APPLICATOR SURGIFLO ENDO (HEMOSTASIS) ×1 IMPLANT
BAG LAPAROSCOPIC 12 15 PORT 16 (BASKET) IMPLANT
BAG RETRIEVAL 12/15 (BASKET)
BAG SPEC RTRVL LRG 6X4 10 (ENDOMECHANICALS)
COVER BACK TABLE 60X90IN (DRAPES) ×2 IMPLANT
COVER TIP SHEARS 8 DVNC (MISCELLANEOUS) ×1 IMPLANT
COVER TIP SHEARS 8MM DA VINCI (MISCELLANEOUS) ×1
DERMABOND ADVANCED (GAUZE/BANDAGES/DRESSINGS) ×1
DERMABOND ADVANCED .7 DNX12 (GAUZE/BANDAGES/DRESSINGS) ×1 IMPLANT
DRAPE ARM DVNC X/XI (DISPOSABLE) ×4 IMPLANT
DRAPE COLUMN DVNC XI (DISPOSABLE) ×1 IMPLANT
DRAPE DA VINCI XI ARM (DISPOSABLE) ×4
DRAPE DA VINCI XI COLUMN (DISPOSABLE) ×1
DRAPE SHEET LG 3/4 BI-LAMINATE (DRAPES) ×2 IMPLANT
DRAPE SURG IRRIG POUCH 19X23 (DRAPES) ×2 IMPLANT
ELECT REM PT RETURN 15FT ADLT (MISCELLANEOUS) ×2 IMPLANT
FLOSEAL 10ML (HEMOSTASIS) ×1 IMPLANT
GLOVE BIO SURGEON STRL SZ 6 (GLOVE) ×8 IMPLANT
GLOVE BIO SURGEON STRL SZ 6.5 (GLOVE) ×2 IMPLANT
GOWN STRL REUS W/ TWL LRG LVL3 (GOWN DISPOSABLE) ×2 IMPLANT
GOWN STRL REUS W/TWL LRG LVL3 (GOWN DISPOSABLE) ×4
HEMOSTAT SURGICEL 4X8 (HEMOSTASIS) ×1 IMPLANT
HOLDER FOLEY CATH W/STRAP (MISCELLANEOUS) ×2 IMPLANT
IRRIG SUCT STRYKERFLOW 2 WTIP (MISCELLANEOUS) ×4
IRRIGATION SUCT STRKRFLW 2 WTP (MISCELLANEOUS) ×1 IMPLANT
KIT PROCEDURE DA VINCI SI (MISCELLANEOUS)
KIT PROCEDURE DVNC SI (MISCELLANEOUS) IMPLANT
MANIPULATOR UTERINE 4.5 ZUMI (MISCELLANEOUS) ×2 IMPLANT
NDL SPNL 18GX3.5 QUINCKE PK (NEEDLE) IMPLANT
NEEDLE SPNL 18GX3.5 QUINCKE PK (NEEDLE) IMPLANT
OBTURATOR OPTICAL STANDARD 8MM (TROCAR) ×1
OBTURATOR OPTICAL STND 8 DVNC (TROCAR) ×1
OBTURATOR OPTICALSTD 8 DVNC (TROCAR) ×1 IMPLANT
PACK ROBOT GYN CUSTOM WL (TRAY / TRAY PROCEDURE) ×2 IMPLANT
PAD POSITIONING PINK XL (MISCELLANEOUS) ×2 IMPLANT
POUCH SPECIMEN RETRIEVAL 10MM (ENDOMECHANICALS) IMPLANT
SEAL CANN UNIV 5-8 DVNC XI (MISCELLANEOUS) ×4 IMPLANT
SEAL XI 5MM-8MM UNIVERSAL (MISCELLANEOUS) ×4
SET TRI-LUMEN FLTR TB AIRSEAL (TUBING) ×2 IMPLANT
SURGIFLO W/THROMBIN 8M KIT (HEMOSTASIS) IMPLANT
SUT VIC AB 0 CT1 27 (SUTURE)
SUT VIC AB 0 CT1 27XBRD ANTBC (SUTURE) IMPLANT
SUT VIC AB 3-0 SH 27 (SUTURE) ×2
SUT VIC AB 3-0 SH 27XBRD (SUTURE) IMPLANT
SYR 10ML LL (SYRINGE) IMPLANT
TOWEL OR NON WOVEN STRL DISP B (DISPOSABLE) ×2 IMPLANT
TRAP SPECIMEN MUCOUS 40CC (MISCELLANEOUS) IMPLANT
TRAY FOLEY MTR SLVR 16FR STAT (SET/KITS/TRAYS/PACK) ×2 IMPLANT
UNDERPAD 30X30 (UNDERPADS AND DIAPERS) ×2 IMPLANT
WATER STERILE IRR 1000ML POUR (IV SOLUTION) ×2 IMPLANT

## 2017-08-15 NOTE — Anesthesia Procedure Notes (Signed)
Date/Time: 08/15/2017 12:16 PM Performed by: Cynda Familia, CRNA Oxygen Delivery Method: Simple face mask Placement Confirmation: positive ETCO2 and breath sounds checked- equal and bilateral Dental Injury: Teeth and Oropharynx as per pre-operative assessment

## 2017-08-15 NOTE — Anesthesia Preprocedure Evaluation (Addendum)
Anesthesia Evaluation  Patient identified by MRN, date of birth, ID band Patient awake    Reviewed: Allergy & Precautions, NPO status , Patient's Chart, lab work & pertinent test results  Airway Mallampati: II  TM Distance: >3 FB   Mouth opening: Limited Mouth Opening  Dental  (+) Teeth Intact, Dental Advisory Given, Caps, Chipped   Pulmonary neg pulmonary ROS,    breath sounds clear to auscultation       Cardiovascular hypertension, Pt. on medications  Rhythm:Regular Rate:Normal     Neuro/Psych negative neurological ROS  negative psych ROS   GI/Hepatic negative GI ROS, Neg liver ROS,   Endo/Other  negative endocrine ROS  Renal/GU negative Renal ROS     Musculoskeletal negative musculoskeletal ROS (+)   Abdominal Normal abdominal exam  (+)   Peds  Hematology negative hematology ROS (+)   Anesthesia Other Findings   Reproductive/Obstetrics                           Lab Results  Component Value Date   WBC 4.2 08/07/2017   HGB 13.0 08/07/2017   HCT 39.6 08/07/2017   MCV 93.2 08/07/2017   PLT 391 08/07/2017   Lab Results  Component Value Date   CREATININE 0.57 08/07/2017   BUN 17 08/07/2017   NA 142 08/07/2017   K 4.5 08/07/2017   CL 106 08/07/2017   CO2 27 08/07/2017   No results found for: INR, PROTIME  EKG: normal sinus rhythm.  Anesthesia Physical Anesthesia Plan  ASA: II  Anesthesia Plan: General   Post-op Pain Management:    Induction: Intravenous  PONV Risk Score and Plan: 4 or greater and Ondansetron, Dexamethasone, Treatment may vary due to age or medical condition and Midazolam  Airway Management Planned: Oral ETT and Video Laryngoscope Planned  Additional Equipment: None  Intra-op Plan:   Post-operative Plan: Extubation in OR  Informed Consent: I have reviewed the patients History and Physical, chart, labs and discussed the procedure including the risks,  benefits and alternatives for the proposed anesthesia with the patient or authorized representative who has indicated his/her understanding and acceptance.   Dental advisory given  Plan Discussed with: CRNA  Anesthesia Plan Comments:        Anesthesia Quick Evaluation

## 2017-08-15 NOTE — H&P (Signed)
H&P: Gyn-Onc  Consult was requested by Dr. Talbert Nan for the evaluation of Renee Watkins 72 y.o. female  CC:  CIN III, VAIN III, vaginal agglutination   Assessment/Plan:  Renee Watkins  is a 72 y.o.  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 Failed therapy with efudex. Given that we cannot evaulate the upper vagina and cervix, I believe it is reasonable to proceed with surgery to remove the cervix and upper vagina for diagnostic purposes. I extensively counseled [name] that this surgery would not cure or treat her dysplasia or HPV, and that she would almost certainly continue to have abnormal pap smears that would require treatment with Dr Talbert Nan, however, this surgery would eliminate the uncertainty of incomplete upper vaginal evaluation. It was unclear to me that Tiffanny fully understands this despite extensive counseling.  I discussed that upper vaginectomy requires a more extensive dissection that hysterectomy (a modified radical approach) which places her at increased risk for injury to bladder and ureters. I discussed other surgical risks including  bleeding, infection, damage to internal organs (such as bladder,ureters, bowels), blood clot, reoperation and rehospitalization.  HPI: Renee Watkins is a 72 year old woman who is seen in consultation at the request of Dr Talbert Nan for vaginal dysplasia and agglutination.  The patient has a long standing history of abnormal paps and high risk HPV.  She had a pap in August 2016 which showed ASCUS with high risk HPV positive. Cytology was negaive in 2017 but high risk HPV remained present.  On 11/08/16 she underwent a pap which showed ASC-H with positive high risk HPV. Colposcopy of the vagina on 11/23/16 showed an agglutinated upper vagina and the cervix could not be visualized. However, there was an area of increased pigmentation of the vagina just under the fusion at 6 o'clock and a biopsy was taken. There was  decreased lugols staining throughout the vagina and a biopsy was taken at 12 o'clock.  These biopsies revealed "CIN 2-3".  A TVUS was obtained on 12/20/16 to identify the cervix as none could be seen on exam. The US showed a 6.8x3.6x3.2cm uterus with a thin endometrium. The cervix was grossly normal on [place].   Interval Hx:  She was treated with 5-FU vaginally for 2 months in December and January, 2019. She then underwent repeat pap on 06/15/17 which showed VAIN/CIN 2-3 with high risk HPV detected.   Current Meds:  Outpatient Encounter Medications as of 07/07/2017  Medication Sig  . CALCIUM PO Take by mouth daily.  . Cholecalciferol (VITAMIN D PO) Take 2,000 Int'l Units by mouth daily.  Marland Kitchen lisinopril (PRINIVIL,ZESTRIL) 10 MG tablet Take 10 mg by mouth every morning.   . Multiple Vitamin (MULTIVITAMIN) tablet Take 1 tablet by mouth daily.  . NONFORMULARY OR COMPOUNDED ITEM Vaginal 5FU (5% concentration, 1gm once weekly (at night) x 8 weeks). Patient will need vaginal applicators. Dispense 8 grams 0RF  . Vitamin D, Ergocalciferol, (DRISDOL) 50000 UNITS CAPS Take 50,000 Units by mouth every 30 (thirty) days.  . Estradiol 10 MCG TABS vaginal tablet Place 1 tablet (10 mcg total) vaginally 2 (two) times a week. (Patient not taking: Reported on 07/07/2017)   No facility-administered encounter medications on file as of 07/07/2017.     Allergy:  Allergies  Allergen Reactions  . Penicillins Anaphylaxis    Has patient had a PCN reaction causing immediate rash, facial/tongue/throat swelling, SOB or lightheadedness with hypotension: yes Has patient had a PCN reaction causing  severe rash involving mucus membranes or skin necrosis: no Has patient had a PCN reaction that required hospitalization: yes Has patient had a PCN reaction occurring within the last 10 years: no If all of the above answers are "NO", then may proceed with Cephalosporin use.     Social Hx:   Social History   Socioeconomic  History  . Marital status: Divorced    Spouse name: Not on file  . Number of children: Not on file  . Years of education: Not on file  . Highest education level: Not on file  Occupational History  . Not on file  Social Needs  . Financial resource strain: Not on file  . Food insecurity:    Worry: Not on file    Inability: Not on file  . Transportation needs:    Medical: Not on file    Non-medical: Not on file  Tobacco Use  . Smoking status: Never Smoker  . Smokeless tobacco: Never Used  Substance and Sexual Activity  . Alcohol use: No  . Drug use: No  . Sexual activity: Never    Birth control/protection: Surgical    Comment: BTL  Lifestyle  . Physical activity:    Days per week: Not on file    Minutes per session: Not on file  . Stress: Not on file  Relationships  . Social connections:    Talks on phone: Not on file    Gets together: Not on file    Attends religious service: Not on file    Active member of club or organization: Not on file    Attends meetings of clubs or organizations: Not on file    Relationship status: Not on file  . Intimate partner violence:    Fear of current or ex partner: Not on file    Emotionally abused: Not on file    Physically abused: Not on file    Forced sexual activity: Not on file  Other Topics Concern  . Not on file  Social History Narrative  . Not on file    Past Surgical Hx:  Past Surgical History:  Procedure Laterality Date  . Base Wedge Osteomy Right 03/10/10   right foot  . BUNIONECTOMY Right 03/08/12   Keller right foot  . Capsulotomy IPJ Right 03/08/12   Right foot 2  . CATARACT EXTRACTION     both eyes  . COLONOSCOPY WITH PROPOFOL N/A 07/08/2014   Procedure: COLONOSCOPY WITH PROPOFOL;  Surgeon: Garlan Fair, MD;  Location: [facility];  Service: Endoscopy;  Laterality: N/A;  . COLPOSCOPY     2015, ECC neg, polyp neg, 2016 & 2017  . Thedora Hinders w/graft Right 03/10/10   right foot  . FOOT SURGERY Right 2000,  [id number]  . FOOT SURGERY Left 2002, 2006, 2010  . Hammer toe repair Right 03/10/10   right foot # 5  . Hammer toe repair Left 03/02/11   left foot 3 . 5  . MANDIBLE FRACTURE SURGERY    . TUBAL LIGATION      Past Medical Hx:  Past Medical History:  Diagnosis Date  . Abnormal Pap smear of cervix 09/30/2013   neg pap HPV HR +, 8-10-16ASCUS HPV HR+, 8/17 neg HPV HR+  . Bilateral artificial lens implant   . Hypertension   . Osteoporosis   . STD (sexually transmitted disease)    HSV    Past Gynecological History:  + high risk HPV Patient's last menstrual period was 03/22/2003 (exact date).  Family Hx:  Family History  Problem Relation Age of Onset  . Hypertension Sister   . Hypertension Brother   . Hypertension Mother     Review of Systems:  Constitutional  Feels well,    ENT Normal appearing ears and nares bilaterally Skin/Breast  No rash, sores, jaundice, itching, dryness Cardiovascular  No chest pain, shortness of breath, or edema  Pulmonary  No cough or wheeze.  Gastro Intestinal  No nausea, vomitting, or diarrhoea. No bright red blood per rectum, no abdominal pain, change in bowel movement, or constipation.  Genito Urinary  No frequency, urgency, dysuria, no bleeding or discharge. Not sexually active Musculo Skeletal  No myalgia, arthralgia, joint swelling or pain  Neurologic  No weakness, numbness, change in gait,  Psychology  No depression, anxiety, insomnia.   Vitals:  Blood pressure (!) 143/77, pulse 91, temperature 97.8 F (36.6 C), temperature source Oral, resp. rate 16, height 5\' 3"  (1.6 m), weight 111 lb (50.3 kg), last menstrual period 03/22/2003, SpO2 100 %.  Physical Exam: WD in NAD Neck  Supple NROM, without any enlargements.  Lymph Node Survey No cervical supraclavicular or inguinal adenopathy Cardiovascular  Pulse normal rate, regularity and rhythm. S1 and S2 normal.  Lungs  Clear to auscultation bilateraly, without  wheezes/crackles/rhonchi. Good air movement.  Skin  No rash/lesions/breakdown  Psychiatry  Alert and oriented to person, place, and time  Abdomen  Normoactive bowel sounds, abdomen soft, non-tender and very thin without evidence of hernia.  Back No CVA tenderness Genito Urinary  Vulva/vagina: Normal external female genitalia.   No lesions. No discharge or bleeding.  Bladder/urethra:  No lesions or masses, well supported bladder  Vagina: atrophic, no gross lesions seen suggestive of invasive carcinoma, and no palpable masses. The biopsy sites are seen. There is agglutination of the upper vagina such that the cervix cannot be visualized.   Cervix: unable to be seen but palpably normal on rectovaginal exam.  Uterus:  Small, mobile, no parametrial involvement or nodularity on RV exam  Adnexa: no masses. Rectal  Good tone, no masses no cul de sac nodularity.  Extremities  No bilateral cyanosis, clubbing or edema.   Thereasa Solo, MD  08/15/2017, 9:15 AM

## 2017-08-15 NOTE — Anesthesia Procedure Notes (Signed)
Procedure Name: Intubation Date/Time: 08/15/2017 9:56 AM Performed by: Cynda Familia, CRNA Pre-anesthesia Checklist: Patient identified, Emergency Drugs available, Suction available and Patient being monitored Patient Re-evaluated:Patient Re-evaluated prior to induction Oxygen Delivery Method: Circle System Utilized Preoxygenation: Pre-oxygenation with 100% oxygen Induction Type: IV induction Ventilation: Mask ventilation without difficulty Laryngoscope Size: Miller and 2 Grade View: Grade I Tube type: Oral Number of attempts: 1 Airway Equipment and Method: Stylet Placement Confirmation: ETT inserted through vocal cords under direct vision,  positive ETCO2 and breath sounds checked- equal and bilateral Secured at: 21 cm Tube secured with: Tape Dental Injury: Teeth and Oropharynx as per pre-operative assessment  Comments: Smooth IV induction- Hollis-- intubation AM CRNA atraumatic-- teeth and mouth as preop--- chipped tooth upper left front present prior to laryngoscopy-- unchanged after laryngoscopy-- bilat BS hollis

## 2017-08-15 NOTE — Anesthesia Postprocedure Evaluation (Signed)
Anesthesia Post Note  Patient: Renee Watkins  Procedure(s) Performed: XI ROBOTIC ASSISTED MODIFIED RADICAL TOTAL HYSTERECTOMY WITH UPPER VAGINECTOMY (N/A )     Patient location during evaluation: PACU Anesthesia Type: General Level of consciousness: awake and alert Pain management: pain level controlled Vital Signs Assessment: post-procedure vital signs reviewed and stable Respiratory status: spontaneous breathing, nonlabored ventilation, respiratory function stable and patient connected to nasal cannula oxygen Cardiovascular status: blood pressure returned to baseline and stable Postop Assessment: no apparent nausea or vomiting Anesthetic complications: no    Last Vitals:  Vitals:   08/15/17 1400 08/15/17 1507  BP: 130/70 128/70  Pulse: 72 66  Resp: 12 14  Temp: 36.4 C 36.6 C  SpO2: 98% 100%    Last Pain:  Vitals:   08/15/17 1507  TempSrc: Oral  PainSc:                  Effie Berkshire

## 2017-08-15 NOTE — Transfer of Care (Signed)
Immediate Anesthesia Transfer of Care Note  Patient: Renee Watkins  Procedure(s) Performed: XI ROBOTIC ASSISTED MODIFIED RADICAL TOTAL HYSTERECTOMY WITH UPPER VAGINECTOMY (N/A )  Patient Location: PACU  Anesthesia Type:General  Level of Consciousness: sedated  Airway & Oxygen Therapy: Patient Spontanous Breathing and Patient connected to face mask oxygen  Post-op Assessment: Report given to RN and Post -op Vital signs reviewed and stable  Post vital signs: Reviewed and stable  Last Vitals:  Vitals Value Taken Time  BP 156/130 08/15/2017 12:24 PM  Temp    Pulse 59 08/15/2017 12:25 PM  Resp 9 08/15/2017 12:25 PM  SpO2 100 % 08/15/2017 12:25 PM  Vitals shown include unvalidated device data.  Last Pain:  Vitals:   08/15/17 0800  TempSrc:   PainSc: 0-No pain         Complications: No apparent anesthesia complications

## 2017-08-15 NOTE — Discharge Instructions (Signed)
08/15/2017  Return to work: 4-6 weeks  Activity: 1. Be up and out of the bed during the day.  Take a nap if needed.  You may walk up steps but be careful and use the hand rail.  Stair climbing will tire you more than you think, you may need to stop part way and rest.   2. No lifting or straining for 6 weeks.  3. No driving for 1-2 weeks.  Do Not drive if you are taking narcotic pain medicine.  4. Shower daily.  Use soap and water on your incision and pat dry; don't rub.   5. No sexual activity and nothing in the vagina for 8 weeks.  Medications:  - Take ibuprofen and tylenol first line for pain control. Take these regularly (every 6 hours) to decrease the build up of pain.  - If necessary, for severe pain not relieved by ibuprofen, take percocet.  - While taking percocet you should take sennakot every night to reduce the likelihood of constipation. If this causes diarrhea, stop its use.  Diet: 1. Low sodium Heart Healthy Diet is recommended.  2. It is safe to use a laxative if you have difficulty moving your bowels.   Wound Care: 1. Keep clean and dry.  Shower daily.  Reasons to call the Doctor:   Fever - Oral temperature greater than 100.4 degrees Fahrenheit  Foul-smelling vaginal discharge  Difficulty urinating  Nausea and vomiting  Increased pain at the site of the incision that is unrelieved with pain medicine.  Difficulty breathing with or without chest pain  New calf pain especially if only on one side  Sudden, continuing increased vaginal bleeding with or without clots.   Follow-up: 1. See Everitt Amber in 3 weeks.  Contacts: For questions or concerns you should contact:  Dr. Everitt Amber at [phone] After hours and on week-ends call [phone] and ask to speak to the physician on call for Gynecologic Oncology

## 2017-08-15 NOTE — Op Note (Signed)
OPERATIVE NOTE 09/02/14  Surgeon: Donaciano Eva   Assistants: 08/15/17 (an MD assistant was necessary for tissue manipulation, management of robotic instrumentation, retraction and positioning due to the complexity of the case and hospital policies).   Anesthesia: General endotracheal anesthesia  ASA Class: 3   Pre-operative Diagnosis: CIN III, VAIN III  Post-operative Diagnosis: same  Operation: Robotic-assisted type II modified radical laparoscopic hysterectomy with upper vaginectomy and bilateral salpingoophorectomy   Surgeon: Donaciano Eva  Assistant Surgeon: Lahoma Crocker MD  Anesthesia: GET  Urine Output: 300cc  Operative Findings:  Redundant sigmoid colon, normal appearing 6cm uterus. Normal tubes and ovaries, agglutinated upper vagina (unable to see cervix).  Estimated Blood Loss:  <20cc      Total IV Fluids: 800 ml         Specimens: uterus with cervix and upper vagina and bilateral tubes and ovaries         Complications:  None; patient tolerated the procedure well.         Disposition: PACU - hemodynamically stable.  Procedure Details  The patient was seen in the Holding Room. The risks, benefits, complications, treatment options, and expected outcomes were discussed with the patient.  The patient concurred with the proposed plan, giving informed consent.  The site of surgery properly noted/marked. The patient was identified as Renee Watkins and the procedure verified as a Robotic-assisted modified radical hysterectomy with bilateral salpingo oophorectomy. A Time Out was held and the above information confirmed.  Procedure:  The patient was brought to the operating room where general anesthesia was administered with no complications.  The patient was placed in the dorsal lithotomy position in padded Allen stirrups.  The arms were tucked at the sides with gel pads protecting the elbows and foam protecting the hands. After induction of anesthesia, the  patient was draped and prepped in the usual sterile manner.The abdominal drape was placed after the CholoraPrep had been allowed to dry for 3 minutes.  Her arms were tucked to her side with all appropriate precautions.  The chest was secured to the table.  The patient was placed in the semi-lithotomy position in [place].  The perineum was prepped with Betadine.  Foley catheter was placed.  An EEA sizer (large size) was placed vaginally to define the vaginal fornices as a uterine manipulator could not be placed due to the agglutinated upper vagina.  A second time-out was performed.  OG tube placement was confirmed and to suction.    Next, a 5 mm skin incision was made 1 cm below the subcostal margin in the midclavicular line.  The 5 mm Optiview port and scope was used for direct entry.  Opening pressure was under 10 mm CO2.  The abdomen was insufflated and the findings were noted as above.   At this point and all points during the procedure, the patient's intra-abdominal pressure did not exceed 15 mmHg. Next, a 8 mm skin incision was made at the umbilicus and a right and left port was placed about 10 cm lateral to the robot port on the right and left side.  A fourth arm was placed in the left lower quadrant 2 cm above and superior and medial to the anterior superior iliac spine.  All ports were placed under direct visualization.  There was oozing from a site on the liver capsule that had been abraded by the assistant port. This was made hemostatic with floseal and a piece of surgicell. The patient was placed in  steep Trendelenburg.  Bowel was away into the upper abdomen.  The robot was docked in the normal manner.  The right retroperitoneum in the pelvis was opened parallel to the IP ligament. The right paravesical space was developed with monopolar and sharp dissection. It was held open with tension on the median umbilical ligament with the forth arm. The pararectal space was opened with blunt and sharp  dissection to mobilize the ureter off of the medial surface of the internal iliac artery. The medial leaf of the broad ligament containing the ureter was held medially (opening the pararectal space) by the assistant's grasper.   The modified radical hysterectomy was begun by first skeletonizing the right uterine artery at its origin from the internal iliac artery by skeletonizing it 360 degrees and defining the parauterine web between the paravesical and pararectal spaces. The right ureter was skeletonized off of its attachments to the broad ligament and mobilized laterally. Using meticulous blunt and sparing monopolar dissection in short controlled bursts, the right ureter was untunnelled from under the right uterine artery. The anterior vessicouterine ligament was developed and the bladder flap was taken down to the EEA sizer. This then definied the anterior bladder pillar. The uterine artery was bipolar fulgarated to seal it at the level of the ureter, then transected. The uterine vein was also sealed and resected. The ureter was dissected off of its attachments to the anterior vagina. The anterior bladder pillar was skeletonized and sealed with bipolar energy while the ureter was deflected distally. The posterior bladder pillar was also dissected with monopolar scissors from the upper vagina which facilitated taking down the cardinal ligaments immediately lateral to the vagina (no additional parametrium was taken).  The rectovaginal septum was entered posteriorally with sharp dissection and the rectum was dissected off of the vagina. A window was created in the broad ligament with care to mobilize the ureter laterally. This skeletonized the IP ligament which was sealed with bipolar energy and transected.The right uterosacral ligament was transected immediately adjacent to the vagina.  The left radical hysterectomy was then performed by first skeletonizing the left uterine artery at its origin from the  internal iliac artery by skeletonizing it 360 degrees and defining the parauterine web between the paravesical and pararectal spaces. The left ureter was skeletonized off of its attachments to the broad ligament and mobilized laterally. Using meticulous blunt and sparing monopolar dissection in short controlled bursts, the left ureter was untunnelled from under the left uterine artery. The left anterior vessicouterine ligament was developed and the bladder flap was taken down to below the level of the EEA and below the rounded portion of the EEA sizer. This then definied the anterior bladder pillar. The uterine artery was bipolar fulgarated to seal it, then transected. The uterine vein was also sealed and resected at the level of the ureter. The lateral boundary of the parametrium was taken down with biploar and monopolar dissection to the level of the deep vaginal vein. The ureter was dissected off of its attachments to the anterior vagina. The anterior bladder pillar was skeletonized and sealed with bipolar energy while the ureter was deflected distally. The posterior bladder pillar was also dissected with monopolar scissors from the upper vagina.  The rectovaginal septum was entered posteriorally with sharp dissection and the rectum was dissected off of the vagina. A window was created in the broad ligament with care to mobilize the ureter laterally. This skeletonized the IP ligament which was sealed with bipolar energy and transected.The  left uterosacral ligament was transected at the level of the vagina as part of taking down the cardinal ligaments immediately lateral to the uterus and cervix.  The colpotomy was made and the uterus, cervix, upper vagina, bilateral ovaries and tubes were amputated and delivered through the vagina.  Pedicles were inspected and excellent hemostasis was achieved.    The colpotomy at the vaginal cuff was closed with two 0-Vicryl on a CT1 needle in a running manner vertically  to preserve vaginal length. An area of right lateral posterior bladder was imbricated serosally where it was visually thinned from the dissection.  Irrigation was used and excellent hemostasis was achieved.  At this point in the procedure was completed.  Robotic instruments were removed under direct visulaization.  The robot was undocked. The liver edge was again inspected and visualized with the robotic camera and noted to be hemostatic. The 10 mm ports were closed with Vicryl on a UR-5 needle and the fascia was closed with 0 Vicryl on a UR-5 needle.  The skin was closed with 4-0 Vicryl in a subcuticular manner.  Dermabond was applied.  Sponge, lap and needle counts correct x 2.  The patient was taken to the recovery room in stable condition.  The vagina was swabbed with  minimal bleeding noted.   All instrument and needle counts were correct x  3.   The patient was transferred to the recovery room in a stable condition.  Donaciano Eva, MD

## 2017-08-15 NOTE — Telephone Encounter (Signed)
Returned the daughter's call and gave her the fax number for her FMLA papers. She ask that we forwarded them to [facility].

## 2017-08-16 ENCOUNTER — Encounter (HOSPITAL_COMMUNITY): Payer: Self-pay | Admitting: Gynecologic Oncology

## 2017-08-16 ENCOUNTER — Telehealth: Payer: Self-pay | Admitting: *Deleted

## 2017-08-16 DIAGNOSIS — I10 Essential (primary) hypertension: Secondary | ICD-10-CM | POA: Diagnosis not present

## 2017-08-16 DIAGNOSIS — Z880 Allergy status to penicillin: Secondary | ICD-10-CM | POA: Diagnosis not present

## 2017-08-16 DIAGNOSIS — D259 Leiomyoma of uterus, unspecified: Secondary | ICD-10-CM | POA: Diagnosis not present

## 2017-08-16 DIAGNOSIS — D069 Carcinoma in situ of cervix, unspecified: Secondary | ICD-10-CM | POA: Diagnosis not present

## 2017-08-16 DIAGNOSIS — Z79899 Other long term (current) drug therapy: Secondary | ICD-10-CM | POA: Diagnosis not present

## 2017-08-16 LAB — CBC
HEMATOCRIT: 35.7 % — AB (ref 36.0–46.0)
HEMOGLOBIN: 11.4 g/dL — AB (ref 12.0–15.0)
MCH: 29.5 pg (ref 26.0–34.0)
MCHC: 31.9 g/dL (ref 30.0–36.0)
MCV: 92.2 fL (ref 78.0–100.0)
Platelets: 330 10*3/uL (ref 150–400)
RBC: 3.87 MIL/uL (ref 3.87–5.11)
RDW: 13.3 % (ref 11.5–15.5)
WBC: 6.9 10*3/uL (ref 4.0–10.5)

## 2017-08-16 LAB — BASIC METABOLIC PANEL
Anion gap: 8 (ref 5–15)
BUN: 13 mg/dL (ref 6–20)
CHLORIDE: 102 mmol/L (ref 101–111)
CO2: 25 mmol/L (ref 22–32)
CREATININE: 0.56 mg/dL (ref 0.44–1.00)
Calcium: 8.2 mg/dL — ABNORMAL LOW (ref 8.9–10.3)
GFR calc Af Amer: >60 mL/min (ref >60)
GFR calc non Af Amer: >60 mL/min (ref >60)
Glucose, Bld: 113 mg/dL — ABNORMAL HIGH (ref 65–99)
Potassium: 3.9 mmol/L (ref 3.5–5.1)
SODIUM: 135 mmol/L (ref 135–145)

## 2017-08-16 LAB — GLUCOSE, CAPILLARY: Glucose-Capillary: 103 mg/dL — ABNORMAL HIGH (ref 65–99)

## 2017-08-16 MED ORDER — SENNOSIDES-DOCUSATE SODIUM 8.6-50 MG PO TABS: 2.0000 | ORAL_TABLET | Freq: Every day | ORAL | 0 refills | Status: DC

## 2017-08-16 MED ORDER — ACETAMINOPHEN 500 MG PO TABS
1000.0000 mg | ORAL_TABLET | Freq: Two times a day (BID) | ORAL | 0 refills | Status: DC
Start: 2017-08-16 — End: 2017-09-08

## 2017-08-16 MED ORDER — IBUPROFEN 600 MG PO TABS: 600.0000 mg | ORAL_TABLET | Freq: Four times a day (QID) | ORAL | 0 refills | Status: DC

## 2017-08-16 MED ORDER — OXYCODONE HCL 5 MG PO TABS: 5.0000 mg | ORAL_TABLET | ORAL | 0 refills | Status: DC | PRN

## 2017-08-16 NOTE — Telephone Encounter (Signed)
Called and left the patient a message with her post op appt

## 2017-08-16 NOTE — Discharge Summary (Signed)
Physician Discharge Summary  Patient ID: Renee Watkins MRN: [id number] DOB/AGE: 1946-02-19 72 y.o.  Admit date: 08/15/2017 Discharge date: 08/16/2017  Admission Diagnoses: CIN III (cervical intraepithelial neoplasia grade III) with severe dysplasia  Discharge Diagnoses:  Principal Problem:   CIN III (cervical intraepithelial neoplasia grade III) with severe dysplasia Active Problems:   VAIN III (vaginal intraepithelial neoplasia grade III)   CIN III with severe dysplasia   Discharged Condition: good  Hospital Course:  1/ patient was admitted on 08/15/17 for a robotic modified radical hysterectomy, upper vaginectomy and BSO for CIN 3 and agglutinated vagina 2/ surgery was uncomplicated  3/ on postoperative day 1 the patient was meeting discharge criteria: tolerating PO, voiding urine, ambulating, pain well controlled on oral medications.  4/ new medications on discharge include tylenol, ibuprofen, senakot, oxycodone.    Consults: None  Significant Diagnostic Studies: labs:  CBC    Component Value Date/Time   WBC 6.9 08/16/2017 0421   RBC 3.87 08/16/2017 0421   HGB 11.4 (L) 08/16/2017 0421   HCT 35.7 (L) 08/16/2017 0421   PLT 330 08/16/2017 0421   MCV 92.2 08/16/2017 0421   MCH 29.5 08/16/2017 0421   MCHC 31.9 08/16/2017 0421   RDW 13.3 08/16/2017 0421     Treatments: surgery: see above  Discharge Exam: Blood pressure 131/69, pulse (!) 59, temperature 98 F (36.7 C), temperature source Oral, resp. rate 16, height 5\' 3"  (1.6 m), weight 111 lb (50.3 kg), last menstrual period 03/22/2003, SpO2 100 %. General appearance: alert and cooperative GI: soft, non-tender; bowel sounds normal; no masses,  no organomegaly Incision/Wound: dry x 5 (suture material residual on one - will excise prior to discharge)  Disposition: Discharge disposition: 01-Home or Self Care       Discharge Instructions    (HEART FAILURE PATIENTS) Call MD:  Anytime you have any of the following  symptoms: 1) 3 pound weight gain in 24 hours or 5 pounds in 1 week 2) shortness of breath, with or without a dry hacking cough 3) swelling in the hands, feet or stomach 4) if you have to sleep on extra pillows at night in order to breathe.   Complete by:  As directed    Call MD for:  difficulty breathing, headache or visual disturbances   Complete by:  As directed    Call MD for:  extreme fatigue   Complete by:  As directed    Call MD for:  hives   Complete by:  As directed    Call MD for:  persistant dizziness or light-headedness   Complete by:  As directed    Call MD for:  persistant nausea and vomiting   Complete by:  As directed    Call MD for:  redness, tenderness, or signs of infection (pain, swelling, redness, odor or green/yellow discharge around incision site)   Complete by:  As directed    Call MD for:  severe uncontrolled pain   Complete by:  As directed    Call MD for:  temperature >100.4   Complete by:  As directed    Diet - low sodium heart healthy   Complete by:  As directed    Diet general   Complete by:  As directed    Driving Restrictions   Complete by:  As directed    No driving for 7 days or until off narcotic pain medication   Increase activity slowly   Complete by:  As directed    Remove dressing  in 24 hours   Complete by:  As directed    Sexual Activity Restrictions   Complete by:  As directed    No intercourse for 6 weeks     Allergies as of 08/16/2017      Reactions   Penicillins Anaphylaxis   Has patient had a PCN reaction causing immediate rash, facial/tongue/throat swelling, SOB or lightheadedness with hypotension: yes Has patient had a PCN reaction causing severe rash involving mucus membranes or skin necrosis: no Has patient had a PCN reaction that required hospitalization: yes Has patient had a PCN reaction occurring within the last 10 years: no If all of the above answers are "NO", then may proceed with Cephalosporin use.      Medication List     TAKE these medications   acetaminophen 500 MG tablet Commonly known as:  TYLENOL Take 2 tablets (1,000 mg total) by mouth every 12 (twelve) hours.   CALCIUM PO Take 1 tablet by mouth 2 (two) times daily. 500 mg per   ibuprofen 600 MG tablet Commonly known as:  ADVIL,MOTRIN Take 1 tablet (600 mg total) by mouth every 6 (six) hours.   lisinopril 10 MG tablet Commonly known as:  PRINIVIL,ZESTRIL Take 10 mg by mouth every morning.   multivitamin tablet Take 1 tablet by mouth daily.   oxyCODONE 5 MG immediate release tablet Commonly known as:  Oxy IR/ROXICODONE Take 1 tablet (5 mg total) by mouth every 4 (four) hours as needed for severe pain or breakthrough pain.   senna-docusate 8.6-50 MG tablet Commonly known as:  Senokot-S Take 2 tablets by mouth at bedtime.   valACYclovir 500 MG tablet Commonly known as:  VALTREX Take 1 tablet by mouth daily as needed (flare ups).   Vitamin D (Ergocalciferol) 50000 units Caps capsule Commonly known as:  DRISDOL Take 50,000 Units by mouth every 30 (thirty) days.   Vitamin D 2000 units tablet Take 2,000 Int'l Units by mouth daily.      Follow-up Information    Everitt Amber, MD In 3 weeks.   Specialty:  Obstetrics and Gynecology Contact information: [place] [address] [phone]           Signed: Thereasa Solo 08/16/2017, 7:54 AM

## 2017-08-16 NOTE — Progress Notes (Signed)
Discharge instructions reviewed with patient. All questions answered. Patient wheeled to vehicle with belongings by nurse tech. 

## 2017-08-24 ENCOUNTER — Ambulatory Visit: Payer: 59 | Admitting: Gynecologic Oncology

## 2017-08-30 ENCOUNTER — Ambulatory Visit: Payer: 59 | Admitting: Gynecologic Oncology

## 2017-09-08 ENCOUNTER — Inpatient Hospital Stay: Payer: Medicare HMO | Attending: Gynecologic Oncology | Admitting: Gynecologic Oncology

## 2017-09-08 ENCOUNTER — Encounter: Payer: Self-pay | Admitting: Gynecologic Oncology

## 2017-09-08 VITALS — BP 116/46 | HR 81 | Temp 97.4°F | Resp 20 | Ht 63.0 in | Wt 110.6 lb

## 2017-09-08 DIAGNOSIS — Z90722 Acquired absence of ovaries, bilateral: Secondary | ICD-10-CM | POA: Diagnosis not present

## 2017-09-08 DIAGNOSIS — D072 Carcinoma in situ of vagina: Secondary | ICD-10-CM | POA: Diagnosis not present

## 2017-09-08 DIAGNOSIS — Z90710 Acquired absence of both cervix and uterus: Secondary | ICD-10-CM | POA: Diagnosis not present

## 2017-09-08 DIAGNOSIS — D069 Carcinoma in situ of cervix, unspecified: Secondary | ICD-10-CM

## 2017-09-08 NOTE — Patient Instructions (Addendum)
Please follow-up with Dr Talbert Nan in 12 months for repeat pap testing of the vagina.  You will continue to need pap testing for 20 years, or longer. You continue to be at risk for abnormal paps of the vagina due to your history of having abnormal paps of the cervix.   No heavy lifting (that requires two hands) for one more week.  Nothing to be placed in the vagina for 1 more month.   Contact Dr Denman George at [phone] as needed.

## 2017-09-08 NOTE — Progress Notes (Signed)
Follow-up Note: Gyn-Onc  Consult was requested by Dr. Talbert Nan for the evaluation of Renee Watkins 72 y.o. female  CC:  Chief Complaint  Patient presents with  . VAIN III (vaginal intraepithelial neoplasia grade III)    Assessment/Plan:  Renee Watkins  is a 72 y.o.  year old with history of CIN III, s/p hysterectomy and upper vaginectomy (with BSO) on 08/15/17.  She has done well postop.  Given her history of CIN III, I am recommending annual vaginal cytology with HPV assessments with Dr Talbert Nan.   HPI: Renee Watkins is a 72 year old woman who is seen in consultation at the request of Dr Talbert Nan for vaginal dysplasia and agglutination.  The patient has a long standing history of abnormal paps and high risk HPV.  She had a pap in August 2016 which showed ASCUS with high risk HPV positive. Cytology was negaive in 2017 but high risk HPV remained present.  On 11/08/16 she underwent a pap which showed ASC-H with positive high risk HPV. Colposcopy of the vagina on 11/23/16 showed an agglutinated upper vagina and the cervix could not be visualized. However, there was an area of increased pigmentation of the vagina just under the fusion at 6 o'clock and a biopsy was taken. There was decreased lugols staining throughout the vagina and a biopsy was taken at 12 o'clock.  These biopsies revealed "CIN 2-3".  A TVUS was obtained on 12/20/16 to identify the cervix as none could be seen on exam. The US showed a 6.8x3.6x3.2cm uterus with a thin endometrium. The cervix was grossly normal on [place].   She was treated with 5-FU vaginally for 2 months in December and January, 2019. She then underwent repeat pap on 06/15/17 which showed VAIN/CIN 2-3 with high risk HPV detected.   Interval Hx:  She underwent robotic assisted modified radical (Type II) hysterectomy and upper vaginectomy on 08/15/17. Final pathology showed a focal high grade squamous intrapeithelial lesions (CIN III) in the cervix, but negative  vaginal margins. The ovaries and tubes were benign.  She has done well postop.   Current Meds:  Outpatient Encounter Medications as of 09/08/2017  Medication Sig  . CALCIUM PO Take 1 tablet by mouth 2 (two) times daily. 500 mg per  . Cholecalciferol (VITAMIN D) 2000 units tablet Take 2,000 Int'l Units by mouth daily.   Marland Kitchen lisinopril (PRINIVIL,ZESTRIL) 10 MG tablet Take 10 mg by mouth every morning.   . Multiple Vitamin (MULTIVITAMIN) tablet Take 1 tablet by mouth daily.  . valACYclovir (VALTREX) 500 MG tablet Take 1 tablet by mouth daily as needed (flare ups).   . Vitamin D, Ergocalciferol, (DRISDOL) 50000 UNITS CAPS Take 50,000 Units by mouth every 30 (thirty) days.  . [DISCONTINUED] acetaminophen (TYLENOL) 500 MG tablet Take 2 tablets (1,000 mg total) by mouth every 12 (twelve) hours. (Patient not taking: Reported on 09/08/2017)  . [DISCONTINUED] ibuprofen (ADVIL,MOTRIN) 600 MG tablet Take 1 tablet (600 mg total) by mouth every 6 (six) hours. (Patient not taking: Reported on 09/08/2017)  . [DISCONTINUED] oxyCODONE (OXY IR/ROXICODONE) 5 MG immediate release tablet Take 1 tablet (5 mg total) by mouth every 4 (four) hours as needed for severe pain or breakthrough pain. (Patient not taking: Reported on 09/08/2017)  . [DISCONTINUED] senna-docusate (SENOKOT-S) 8.6-50 MG tablet Take 2 tablets by mouth at bedtime. (Patient not taking: Reported on 09/08/2017)   No facility-administered encounter medications on file as of 09/08/2017.     Allergy:  Allergies  Allergen Reactions  .  Penicillins Anaphylaxis    Has patient had a PCN reaction causing immediate rash, facial/tongue/throat swelling, SOB or lightheadedness with hypotension: yes Has patient had a PCN reaction causing severe rash involving mucus membranes or skin necrosis: no Has patient had a PCN reaction that required hospitalization: yes Has patient had a PCN reaction occurring within the last 10 years: no If all of the above answers are "NO",  then may proceed with Cephalosporin use.     Social Hx:   Social History   Socioeconomic History  . Marital status: Divorced    Spouse name: Not on file  . Number of children: Not on file  . Years of education: Not on file  . Highest education level: Not on file  Occupational History  . Not on file  Social Needs  . Financial resource strain: Not on file  . Food insecurity:    Worry: Not on file    Inability: Not on file  . Transportation needs:    Medical: Not on file    Non-medical: Not on file  Tobacco Use  . Smoking status: Never Smoker  . Smokeless tobacco: Never Used  Substance and Sexual Activity  . Alcohol use: No  . Drug use: No  . Sexual activity: Never    Birth control/protection: Surgical    Comment: BTL  Lifestyle  . Physical activity:    Days per week: Not on file    Minutes per session: Not on file  . Stress: Not on file  Relationships  . Social connections:    Talks on phone: Not on file    Gets together: Not on file    Attends religious service: Not on file    Active member of club or organization: Not on file    Attends meetings of clubs or organizations: Not on file    Relationship status: Not on file  . Intimate partner violence:    Fear of current or ex partner: Not on file    Emotionally abused: Not on file    Physically abused: Not on file    Forced sexual activity: Not on file  Other Topics Concern  . Not on file  Social History Narrative  . Not on file    Past Surgical Hx:  Past Surgical History:  Procedure Laterality Date  . Base Wedge Osteomy Right 03/10/10   right foot  . BUNIONECTOMY Right 03/08/12   Keller right foot  . Capsulotomy IPJ Right 03/08/12   Right foot 2  . CATARACT EXTRACTION     both eyes  . COLONOSCOPY WITH PROPOFOL N/A 07/08/2014   Procedure: COLONOSCOPY WITH PROPOFOL;  Surgeon: Garlan Fair, MD;  Location: [facility];  Service: Endoscopy;  Laterality: N/A;  . COLPOSCOPY     2015, ECC neg, polyp  neg, 2016 & 2017  . Thedora Hinders w/graft Right 03/10/10   right foot  . FOOT SURGERY Right 2000, [id number]  . FOOT SURGERY Left 2002, 2006, 2010  . Hammer toe repair Right 03/10/10   right foot # 5  . Hammer toe repair Left 03/02/11   left foot 3 . 5  . MANDIBLE FRACTURE SURGERY    . ROBOTIC ASSISTED TOTAL HYSTERECTOMY N/A 08/15/2017   Procedure: XI ROBOTIC ASSISTED MODIFIED RADICAL TOTAL HYSTERECTOMY WITH UPPER VAGINECTOMY;  Surgeon: Everitt Amber, MD;  Location: [facility];  Service: Gynecology;  Laterality: N/A;  . TUBAL LIGATION      Past Medical Hx:  Past Medical History:  Diagnosis Date  .  Abnormal Pap smear of cervix 09/30/2013   neg pap HPV HR +, 8-10-16ASCUS HPV HR+, 8/17 neg HPV HR+  . Bilateral artificial lens implant   . Hypertension   . Osteoporosis   . STD (sexually transmitted disease)    HSV    Past Gynecological History:  + high risk HPV Patient's last menstrual period was 03/22/2003 (exact date).  Family Hx:  Family History  Problem Relation Age of Onset  . Hypertension Sister   . Hypertension Brother   . Hypertension Mother     Review of Systems:  Constitutional  Feels well,    ENT Normal appearing ears and nares bilaterally Skin/Breast  No rash, sores, jaundice, itching, dryness Cardiovascular  No chest pain, shortness of breath, or edema  Pulmonary  No cough or wheeze.  Gastro Intestinal  No nausea, vomitting, or diarrhoea. No bright red blood per rectum, no abdominal pain, change in bowel movement, or constipation.  Genito Urinary  No frequency, urgency, dysuria, no bleeding or discharge. Not sexually active Musculo Skeletal  No myalgia, arthralgia, joint swelling or pain  Neurologic  No weakness, numbness, change in gait,  Psychology  No depression, anxiety, insomnia.   Vitals:  Blood pressure (!) 116/46, pulse 81, temperature (!) 97.4 F (36.3 C), temperature source Oral, resp. rate 20, height 5\' 3"  (1.6 m), weight 110 lb 9.6 oz (50.2 kg),  last menstrual period 03/22/2003, SpO2 100 %.  Physical Exam: WD in NAD Neck  Supple NROM, without any enlargements.  Lymph Node Survey No cervical supraclavicular or inguinal adenopathy Cardiovascular  Pulse normal rate, regularity and rhythm. S1 and S2 normal.  Lungs  Clear to auscultation bilateraly, without wheezes/crackles/rhonchi. Good air movement.  Skin  No rash/lesions/breakdown  Psychiatry  Alert and oriented to person, place, and time  Abdomen  Normoactive bowel sounds, abdomen soft, non-tender and very thin without evidence of hernia. Well healed incisions on the abdomen.  Back No CVA tenderness Genito Urinary  Vagina very shortened (3cm) with cuff healing, and scant blood. In tact sutures visible.  Rectal  Good tone, no masses no cul de sac nodularity.  Extremities  No bilateral cyanosis, clubbing or edema.   Thereasa Solo, MD  09/08/2017, 2:11 PM

## 2017-09-13 DIAGNOSIS — M810 Age-related osteoporosis without current pathological fracture: Secondary | ICD-10-CM | POA: Diagnosis not present

## 2017-09-27 ENCOUNTER — Ambulatory Visit: Payer: 59 | Admitting: Gynecologic Oncology

## 2017-11-01 DIAGNOSIS — Z1231 Encounter for screening mammogram for malignant neoplasm of breast: Secondary | ICD-10-CM | POA: Diagnosis not present

## 2017-11-09 ENCOUNTER — Ambulatory Visit: Payer: 59 | Admitting: Certified Nurse Midwife

## 2017-11-09 ENCOUNTER — Encounter: Payer: Self-pay | Admitting: Certified Nurse Midwife

## 2017-11-09 ENCOUNTER — Ambulatory Visit (INDEPENDENT_AMBULATORY_CARE_PROVIDER_SITE_OTHER): Payer: Medicare HMO | Admitting: Certified Nurse Midwife

## 2017-11-09 VITALS — BP 120/62 | HR 74 | Resp 16 | Ht 63.0 in | Wt 108.0 lb

## 2017-11-09 DIAGNOSIS — Z780 Asymptomatic menopausal state: Secondary | ICD-10-CM

## 2017-11-09 DIAGNOSIS — Z01419 Encounter for gynecological examination (general) (routine) without abnormal findings: Secondary | ICD-10-CM | POA: Diagnosis not present

## 2017-11-09 DIAGNOSIS — Z87898 Personal history of other specified conditions: Secondary | ICD-10-CM

## 2017-11-09 DIAGNOSIS — Z8742 Personal history of other diseases of the female genital tract: Secondary | ICD-10-CM

## 2017-11-09 NOTE — Progress Notes (Signed)
72 y.o. [id number] Divorced  [place] here for annual exam. Post menopausal. Denies vaginal issues. Patient recently had Robotic TAH with BSO for persistent CIN 111 with Dr. Denman George in 5/19. Had follow up 09/15/17, all normal per patient and surgical note per epic. Patient "very happy for all the care she has had here and with surgery". Has retired now and "is in charge of her life now". Sees Dr. Laurann Montana for aex/labs/hypertension/osteoporosis. Has had not HSV outbreaks. Eating well and walking for exercise. No health issues today.  Patient's last menstrual period was 03/22/2003 (exact date).          Sexually active: No.  The current method of family planning is status post hysterectomy.    Exercising: Yes.    Walking and yard work Smoker:  no  Review of Systems  All other systems reviewed and are negative.   Health Maintenance: Pap:  06-15-17 HGSIL HPV HR +, CIN 3 with colpo History of Abnormal Pap: yes MMG:  2019 Self Breast exams: yes Colonoscopy:  2016 BMD:   2015 osteoporosis TDaP:  2010 Shingles: had done Pneumonia: had done Hep C and HIV: declines Labs: declines   reports that she has never smoked. She has never used smokeless tobacco. She reports that she does not drink alcohol or use drugs.  Past Medical History:  Diagnosis Date  . Abnormal Pap smear of cervix 09/30/2013   neg pap HPV HR +, 8-10-16ASCUS HPV HR+, 8/17 neg HPV HR+  . Bilateral artificial lens implant   . Hypertension   . Osteoporosis   . STD (sexually transmitted disease)    HSV    Past Surgical History:  Procedure Laterality Date  . Base Wedge Osteomy Right 03/10/10   right foot  . BUNIONECTOMY Right 03/08/12   Keller right foot  . Capsulotomy IPJ Right 03/08/12   Right foot 2  . CATARACT EXTRACTION     both eyes  . COLONOSCOPY WITH PROPOFOL N/A 07/08/2014   Procedure: COLONOSCOPY WITH PROPOFOL;  Surgeon: Garlan Fair, MD;  Location: [facility];  Service: Endoscopy;  Laterality: N/A;  .  COLPOSCOPY     2015, ECC neg, polyp neg, 2016 & 2017  . Thedora Hinders w/graft Right 03/10/10   right foot  . FOOT SURGERY Right 2000, [id number]  . FOOT SURGERY Left 2002, 2006, 2010  . Hammer toe repair Right 03/10/10   right foot # 5  . Hammer toe repair Left 03/02/11   left foot 3 . 5  . MANDIBLE FRACTURE SURGERY    . ROBOTIC ASSISTED TOTAL HYSTERECTOMY N/A 08/15/2017   Procedure: XI ROBOTIC ASSISTED MODIFIED RADICAL TOTAL HYSTERECTOMY WITH UPPER VAGINECTOMY;  Surgeon: Everitt Amber, MD;  Location: [facility];  Service: Gynecology;  Laterality: N/A;  . TUBAL LIGATION      Current Outpatient Medications  Medication Sig Dispense Refill  . CALCIUM PO Take 1 tablet by mouth 2 (two) times daily. 500 mg per    . Cholecalciferol (VITAMIN D) 2000 units tablet Take 2,000 Int'l Units by mouth daily.     Marland Kitchen lisinopril (PRINIVIL,ZESTRIL) 10 MG tablet Take 10 mg by mouth every morning.     . Multiple Vitamin (MULTIVITAMIN) tablet Take 1 tablet by mouth daily.    . valACYclovir (VALTREX) 500 MG tablet Take 1 tablet by mouth daily as needed (flare ups).     . Vitamin D, Ergocalciferol, (DRISDOL) 50000 UNITS CAPS Take 50,000 Units by mouth every 30 (thirty) days.     No current  facility-administered medications for this visit.     Family History  Problem Relation Age of Onset  . Hypertension Sister   . Hypertension Brother   . Hypertension Mother     ROS:  Pertinent items are noted in HPI.  Otherwise, a comprehensive ROS was negative.  Exam:   LMP 03/22/2003 (Exact Date)    Ht Readings from Last 3 Encounters:  09/08/17 5\' 3"  (1.6 m)  08/15/17 5\' 3"  (1.6 m)  08/07/17 5\' 3"  (1.6 m)    General appearance: alert, cooperative and appears stated age Head: Normocephalic, without obvious abnormality, atraumatic Neck: no adenopathy, supple, symmetrical, trachea midline and thyroid normal to inspection and palpation Lungs: clear to auscultation bilaterally Breasts: normal appearance, no masses or  tenderness, No nipple retraction or dimpling, No nipple discharge or bleeding, No axillary or supraclavicular adenopathy Heart: regular rate and rhythm Abdomen: soft, non-tender; no masses,  no organomegaly Extremities: extremities normal, atraumatic, no cyanosis or edema Skin: Skin color, texture, turgor normal. No rashes or lesions Lymph nodes: Cervical, supraclavicular, and axillary nodes normal. No abnormal inguinal nodes palpated Neurologic: Grossly normal   Pelvic: External genitalia:  no lesions              Urethra:  normal appearing urethra with no masses, tenderness or lesions              Bartholin's and Skene's: normal                 Vagina: very short vaginal vault ( uses child speculum) vaginal cuff well healed appearance with atrophy noted              Cervix: absent              Pap taken: No. Bimanual Exam:  Uterus:  uterus absent              Adnexa: no mass, fullness, tenderness and adnexa surgically absent               Rectovaginal: Confirms               Anus:  normal sphincter tone, no lesions  Chaperone present: yes  A:  Well Woman with normal exam  Post menopausal   History of CIN 3/VAIN 3, S/P Robotic TAH with BSO needs yearly paps starting in 2020  Hypertension/vitamin D/Osteoporosis management with PCP  P:   Reviewed health and wellness pertinent to exam  Aware if any vaginal bleeding she needs to call  Continue to follow up with PCP as indicated  Pap smear: not today, but needs yearly starting with next aex.  Stressed importance with patient for follow up. Questions addressed.   counseled on breast self exam, mammography screening, feminine hygiene, adequate intake of calcium and vitamin D, diet and exercise  return annually or prn  An After Visit Summary was printed and given to the patient.

## 2017-11-09 NOTE — Patient Instructions (Signed)

## 2017-11-13 ENCOUNTER — Encounter: Payer: Self-pay | Admitting: Certified Nurse Midwife

## 2018-03-19 DIAGNOSIS — M810 Age-related osteoporosis without current pathological fracture: Secondary | ICD-10-CM | POA: Diagnosis not present

## 2018-04-16 DIAGNOSIS — R636 Underweight: Secondary | ICD-10-CM | POA: Diagnosis not present

## 2018-04-16 DIAGNOSIS — Z23 Encounter for immunization: Secondary | ICD-10-CM | POA: Diagnosis not present

## 2018-04-16 DIAGNOSIS — Z1389 Encounter for screening for other disorder: Secondary | ICD-10-CM | POA: Diagnosis not present

## 2018-04-16 DIAGNOSIS — M810 Age-related osteoporosis without current pathological fracture: Secondary | ICD-10-CM | POA: Diagnosis not present

## 2018-04-16 DIAGNOSIS — Z0000 Encounter for general adult medical examination without abnormal findings: Secondary | ICD-10-CM | POA: Diagnosis not present

## 2018-04-16 DIAGNOSIS — I10 Essential (primary) hypertension: Secondary | ICD-10-CM | POA: Diagnosis not present

## 2018-05-04 DIAGNOSIS — H524 Presbyopia: Secondary | ICD-10-CM | POA: Diagnosis not present

## 2018-07-13 DIAGNOSIS — M810 Age-related osteoporosis without current pathological fracture: Secondary | ICD-10-CM | POA: Diagnosis not present

## 2018-07-20 DIAGNOSIS — R5383 Other fatigue: Secondary | ICD-10-CM | POA: Diagnosis not present

## 2018-07-20 DIAGNOSIS — M810 Age-related osteoporosis without current pathological fracture: Secondary | ICD-10-CM | POA: Diagnosis not present

## 2018-07-30 DIAGNOSIS — B078 Other viral warts: Secondary | ICD-10-CM | POA: Diagnosis not present

## 2018-08-28 ENCOUNTER — Telehealth: Payer: Self-pay | Admitting: Certified Nurse Midwife

## 2018-08-28 NOTE — Telephone Encounter (Signed)
Left message regarding upcoming appointment has been canceled and needs to be rescheduled. °

## 2018-09-25 DIAGNOSIS — M810 Age-related osteoporosis without current pathological fracture: Secondary | ICD-10-CM | POA: Diagnosis not present

## 2018-11-07 DIAGNOSIS — Z1231 Encounter for screening mammogram for malignant neoplasm of breast: Secondary | ICD-10-CM | POA: Diagnosis not present

## 2018-11-12 ENCOUNTER — Other Ambulatory Visit: Payer: Self-pay

## 2018-11-12 ENCOUNTER — Encounter: Payer: Self-pay | Admitting: Certified Nurse Midwife

## 2018-11-12 ENCOUNTER — Ambulatory Visit (INDEPENDENT_AMBULATORY_CARE_PROVIDER_SITE_OTHER): Payer: Medicare HMO | Admitting: Certified Nurse Midwife

## 2018-11-12 ENCOUNTER — Other Ambulatory Visit (HOSPITAL_COMMUNITY)
Admission: RE | Admit: 2018-11-12 | Discharge: 2018-11-12 | Disposition: A | Discharge status: Home / Self Care | Payer: Medicare HMO | Source: Ambulatory Visit | Attending: Certified Nurse Midwife | Admitting: Certified Nurse Midwife

## 2018-11-12 VITALS — BP 110/60 | HR 64 | Temp 97.4°F | Resp 16 | Ht 62.75 in | Wt 101.0 lb

## 2018-11-12 DIAGNOSIS — Z8742 Personal history of other diseases of the female genital tract: Secondary | ICD-10-CM

## 2018-11-12 DIAGNOSIS — D069 Carcinoma in situ of cervix, unspecified: Secondary | ICD-10-CM

## 2018-11-12 DIAGNOSIS — Z01419 Encounter for gynecological examination (general) (routine) without abnormal findings: Secondary | ICD-10-CM

## 2018-11-12 DIAGNOSIS — Z8619 Personal history of other infectious and parasitic diseases: Secondary | ICD-10-CM

## 2018-11-12 DIAGNOSIS — Z124 Encounter for screening for malignant neoplasm of cervix: Secondary | ICD-10-CM | POA: Insufficient documentation

## 2018-11-12 MED ORDER — VALACYCLOVIR HCL 500 MG PO TABS: 500.0000 mg | ORAL_TABLET | Freq: Every day | ORAL | 2 refills | Status: DC | PRN

## 2018-11-12 NOTE — Patient Instructions (Signed)

## 2018-11-12 NOTE — Progress Notes (Signed)
73 y.o. [id number] Divorced  Asian Fe here for annual exam. Denies vaginal bleeding or vaginal dryness. Happy she has had hysterectomy. Sees PCP yearly for labs. Has lost 7 pounds, trying to eating healthy foods now. Has not had any HSV outbreaks and uses Valtrex if needed. Continues on Hypertension management with PCP and medication. Continues to see Dr. Chalmers Cater for osteoporosis management. Thankful to be doing well. No other health issues today.  Patient's last menstrual period was 03/22/2003 (exact date).          Sexually active: No.  The current method of family planning is status post hysterectomy.    Exercising: Yes.    walking Smoker:  no  Review of Systems  Constitutional: Negative.   HENT: Negative.   Eyes: Negative.   Respiratory: Negative.   Cardiovascular: Negative.   Gastrointestinal: Negative.   Genitourinary: Negative.   Musculoskeletal: Negative.   Skin: Negative.   Neurological: Negative.   Endo/Heme/Allergies: Negative.   Psychiatric/Behavioral: Negative.     Health Maintenance: Pap:  06-15-17 HGSIL HPV HR+ History of Abnormal Pap: yes MMG:  11-01-17 category b density birads 2:neg Self Breast exams: no Colonoscopy:  2016 BMD:   2019 TDaP:  2020 Shingles: 2020 Pneumonia: had done Hep C and HIV: not done Labs:  If needed   reports that she has never smoked. She has never used smokeless tobacco. She reports that she does not drink alcohol or use drugs.  Past Medical History:  Diagnosis Date  . Abnormal Pap smear of cervix 09/30/2013   neg pap HPV HR +, 8-10-16ASCUS HPV HR+, 8/17 neg HPV HR+  . Bilateral artificial lens implant   . Hypertension   . Osteoporosis   . STD (sexually transmitted disease)    HSV    Past Surgical History:  Procedure Laterality Date  . Base Wedge Osteomy Right 03/10/10   right foot  . BUNIONECTOMY Right 03/08/12   Keller right foot  . Capsulotomy IPJ Right 03/08/12   Right foot 2  . CATARACT EXTRACTION     both eyes  .  COLONOSCOPY WITH PROPOFOL N/A 07/08/2014   Procedure: COLONOSCOPY WITH PROPOFOL;  Surgeon: Garlan Fair, MD;  Location: [facility];  Service: Endoscopy;  Laterality: N/A;  . COLPOSCOPY     2015, ECC neg, polyp neg, 2016 & 2017  . Thedora Hinders w/graft Right 03/10/10   right foot  . FOOT SURGERY Right 2000, [id number]  . FOOT SURGERY Left 2002, 2006, 2010  . Hammer toe repair Right 03/10/10   right foot # 5  . Hammer toe repair Left 03/02/11   left foot 3 . 5  . MANDIBLE FRACTURE SURGERY    . ROBOTIC ASSISTED TOTAL HYSTERECTOMY N/A 08/15/2017   Procedure: XI ROBOTIC ASSISTED MODIFIED RADICAL TOTAL HYSTERECTOMY WITH UPPER VAGINECTOMY;  Surgeon: Everitt Amber, MD;  Location: [facility];  Service: Gynecology;  Laterality: N/A;  . TUBAL LIGATION      Current Outpatient Medications  Medication Sig Dispense Refill  . CALCIUM PO Take 1 tablet by mouth 2 (two) times daily. 500 mg per    . Cholecalciferol (VITAMIN D) 2000 units tablet Take 2,000 Int'l Units by mouth daily.     Marland Kitchen lisinopril (PRINIVIL,ZESTRIL) 10 MG tablet Take 10 mg by mouth every morning.     . Multiple Vitamin (MULTIVITAMIN) tablet Take 1 tablet by mouth daily.    . valACYclovir (VALTREX) 500 MG tablet Take 1 tablet by mouth daily as needed (flare ups).     Marland Kitchen  Vitamin D, Ergocalciferol, (DRISDOL) 50000 UNITS CAPS Take 50,000 Units by mouth every 30 (thirty) days.     No current facility-administered medications for this visit.     Family History  Problem Relation Age of Onset  . Hypertension Sister   . Hypertension Brother   . Hypertension Mother     ROS:  Pertinent items are noted in HPI.  Otherwise, a comprehensive ROS was negative.  Exam:   LMP 03/22/2003 (Exact Date)    Ht Readings from Last 3 Encounters:  11/09/17 5\' 3"  (1.6 m)  09/08/17 5\' 3"  (1.6 m)  08/15/17 5\' 3"  (1.6 m)    General appearance: alert, cooperative and appears stated age Head: Normocephalic, without obvious abnormality, atraumatic Neck: no  adenopathy, supple, symmetrical, trachea midline and thyroid normal to inspection and palpation Lungs: clear to auscultation bilaterally Breasts: normal appearance, no masses or tenderness, No nipple retraction or dimpling, No nipple discharge or bleeding, No axillary or supraclavicular adenopathy Heart: regular rate and rhythm Abdomen: soft, non-tender; no masses,  no organomegaly Extremities: extremities normal, atraumatic, no cyanosis or edema Skin: Skin color, texture, turgor normal. No rashes or lesions Lymph nodes: Cervical, supraclavicular, and axillary nodes normal. No abnormal inguinal nodes palpated Neurologic: Grossly normal   Pelvic: External genitalia:  no lesions, normal female              Urethra:  normal appearing urethra with no masses, tenderness or lesions              Bartholin's and Skene's: normal                 Vagina: normal appearing vagina with normal color and discharge, no lesions              Cervix: absent              Pap taken: Yes.   Bimanual Exam:  Uterus:  uterus absent              Adnexa: no mass, fullness, tenderness and adnexal surgically absent               Rectovaginal: Confirms               Anus:  normal sphincter tone, no lesions  Chaperone present: yes  A:  Well Woman with normal exam  Menopausal s/p TAH due to CIN3, VAIN 3 history  Hypertension/osteoporosis management with MD  History of HSV 2 Valtrex updated needed.  P:   Reviewed health and wellness pertinent to exam  Aware pap smear indicated  Discussed importance for follow up with MD regarding medical issues. She plans to.  Rx valtrex see order with instructions  Pap smear: yes   counseled on breast self exam, mammography screening, feminine hygiene, adequate intake of calcium and vitamin D, diet and exercise  return annually or prn  An After Visit Summary was printed and given to the patient.

## 2018-11-13 LAB — CYTOLOGY - PAP: Diagnosis: NEGATIVE

## 2018-11-15 ENCOUNTER — Ambulatory Visit: Payer: Medicare HMO | Admitting: Certified Nurse Midwife

## 2019-02-15 ENCOUNTER — Other Ambulatory Visit: Payer: Self-pay | Admitting: Certified Nurse Midwife

## 2019-02-15 DIAGNOSIS — Z8619 Personal history of other infectious and parasitic diseases: Secondary | ICD-10-CM

## 2019-02-18 NOTE — Telephone Encounter (Signed)
Medication refill request: Valtrex Last AEX:  11/12/18 Next AEX: 11/13/19 Last MMG (if hormonal medication request): 11/13/17 Bi-rads 2 benign  Refill authorized: #[id number] with 1 RF

## 2019-04-02 DIAGNOSIS — M810 Age-related osteoporosis without current pathological fracture: Secondary | ICD-10-CM | POA: Diagnosis not present

## 2019-04-19 DIAGNOSIS — I10 Essential (primary) hypertension: Secondary | ICD-10-CM | POA: Diagnosis not present

## 2019-04-19 DIAGNOSIS — Z1389 Encounter for screening for other disorder: Secondary | ICD-10-CM | POA: Diagnosis not present

## 2019-04-19 DIAGNOSIS — Z0000 Encounter for general adult medical examination without abnormal findings: Secondary | ICD-10-CM | POA: Diagnosis not present

## 2019-04-19 DIAGNOSIS — M810 Age-related osteoporosis without current pathological fracture: Secondary | ICD-10-CM | POA: Diagnosis not present

## 2019-05-24 DIAGNOSIS — H524 Presbyopia: Secondary | ICD-10-CM | POA: Diagnosis not present

## 2019-06-12 ENCOUNTER — Encounter: Payer: Self-pay | Admitting: Certified Nurse Midwife

## 2019-07-15 DIAGNOSIS — I10 Essential (primary) hypertension: Secondary | ICD-10-CM | POA: Diagnosis not present

## 2019-07-15 DIAGNOSIS — M810 Age-related osteoporosis without current pathological fracture: Secondary | ICD-10-CM | POA: Diagnosis not present

## 2019-07-22 DIAGNOSIS — M810 Age-related osteoporosis without current pathological fracture: Secondary | ICD-10-CM | POA: Diagnosis not present

## 2019-07-22 DIAGNOSIS — I10 Essential (primary) hypertension: Secondary | ICD-10-CM | POA: Diagnosis not present

## 2019-10-03 DIAGNOSIS — M810 Age-related osteoporosis without current pathological fracture: Secondary | ICD-10-CM | POA: Diagnosis not present

## 2019-11-12 NOTE — Progress Notes (Signed)
74 y.o. G2P2002 Divorced Asian Not Hispanic or Latino female here for annual exam.      S/P robotic assisted modified radical hysterectomy and upper vaginectomy on 5/19 with Dr Lianne Bushy. Pathology with CIN III, negative vaginal margins.   No vaginal bleeding.  Occasional HSV outbreaks, needs a refill.   Patient's last menstrual period was 03/22/2003 (exact date).          Sexually active: No.  The current method of family planning is post menopausal status.    Exercising: Yes.    walking house work Smoker:  no  Health Maintenance: Pap:11/12/2018 WNL  06-15-17 HGSIL HPV HR+ s/p hysterectomy in 5/19.  History of abnormal Pap:  Yes 2019 HR HPV + MMG:  2020 report requested8/24/21. has mammo scheduled for 11/13/19 BMD:   2021 Osteoporosis, managed by Dr Chalmers Cater Colonoscopy: 2016 TDaP:  2020 Gardasil: NA   reports that she has never smoked. She has never used smokeless tobacco. She reports that she does not drink alcohol and does not use drugs.  Past Medical History:  Diagnosis Date  . Abnormal Pap smear of cervix 09/30/2013   neg pap HPV HR +, 8-10-16ASCUS HPV HR+, 8/17 neg HPV HR+  . Bilateral artificial lens implant   . Hypertension   . Osteoporosis   . STD (sexually transmitted disease)    HSV    Past Surgical History:  Procedure Laterality Date  . Base Wedge Osteomy Right 03/10/10   right foot  . BUNIONECTOMY Right 03/08/12   Keller right foot  . Capsulotomy IPJ Right 03/08/12   Right foot 2  . CATARACT EXTRACTION     both eyes  . COLONOSCOPY WITH PROPOFOL N/A 07/08/2014   Procedure: COLONOSCOPY WITH PROPOFOL;  Surgeon: Garlan Fair, MD;  Location: [facility];  Service: Endoscopy;  Laterality: N/A;  . COLPOSCOPY     2015, ECC neg, polyp neg, 2016 & 2017  . Thedora Hinders w/graft Right 03/10/10   right foot  . FOOT SURGERY Right 2000, [id number]  . FOOT SURGERY Left 2002, 2006, 2010  . Hammer toe repair Right 03/10/10   right foot # 5  . Hammer toe repair Left  03/02/11   left foot 3 . 5  . MANDIBLE FRACTURE SURGERY    . ROBOTIC ASSISTED TOTAL HYSTERECTOMY N/A 08/15/2017   Procedure: XI ROBOTIC ASSISTED MODIFIED RADICAL TOTAL HYSTERECTOMY WITH UPPER VAGINECTOMY;  Surgeon: Everitt Amber, MD;  Location: [facility];  Service: Gynecology;  Laterality: N/A;  . TUBAL LIGATION      Current Outpatient Medications  Medication Sig Dispense Refill  . CALCIUM PO Take 1 tablet by mouth 2 (two) times daily. 500 mg per    . Cholecalciferol (VITAMIN D) 2000 units tablet Take 2,000 Int'l Units by mouth daily.     Marland Kitchen lisinopril (PRINIVIL,ZESTRIL) 10 MG tablet Take 10 mg by mouth every morning.     . Multiple Vitamin (MULTIVITAMIN) tablet Take 1 tablet by mouth daily.    . valACYclovir (VALTREX) 500 MG tablet TAKE 1 TABLET (500 MG TOTAL) BY MOUTH DAILY AS NEEDED (FLARE UPS). 90 tablet 3  . Vitamin D, Ergocalciferol, (DRISDOL) 50000 UNITS CAPS Take 50,000 Units by mouth every 30 (thirty) days.     No current facility-administered medications for this visit.    Family History  Problem Relation Age of Onset  . Hypertension Sister   . Hypertension Brother   . Hypertension Mother     Review of Systems  All other systems reviewed and are negative.  Exam:   BP 122/68   Pulse 70   Ht 5\' 3"  (1.6 m)   Wt 103 lb 12.8 oz (47.1 kg)   LMP 03/22/2003 (Exact Date)   SpO2 100%   BMI 18.39 kg/m   Weight change: @WEIGHTCHANGE @ Height:   Height: 5\' 3"  (160 cm)  Ht Readings from Last 3 Encounters:  11/14/19 5\' 3"  (1.6 m)  11/12/18 5' 2.75" (1.594 m)  11/09/17 5\' 3"  (1.6 m)    General appearance: alert, cooperative and appears stated age Head: Normocephalic, without obvious abnormality, atraumatic Neck: no adenopathy, supple, symmetrical, trachea midline and thyroid normal to inspection and palpation Lungs: clear to auscultation bilaterally Cardiovascular: regular rate and rhythm Breasts: normal appearance, no masses or tenderness Abdomen: soft, non-tender; non  distended,  no masses,  no organomegaly Extremities: extremities normal, atraumatic, no cyanosis or edema Skin: Skin color, texture, turgor normal. No rashes or lesions Lymph nodes: Cervical, supraclavicular, and axillary nodes normal. No abnormal inguinal nodes palpated Neurologic: Grossly normal   Pelvic: External genitalia:  no lesions              Urethra:  normal appearing urethra with no masses, tenderness or lesions              Bartholins and Skenes: normal                 Vagina: shortened, atrophic vagina. No lesions.               Cervix: absent               Bimanual Exam:  Uterus:  uterus absent              Adnexa: no mass, fullness, tenderness               Rectovaginal: Confirms               Anus:  normal sphincter tone, no lesions  Shanon Petty chaperoned for the exam.  A:  Well Woman with normal exam  H/O HSV, occasional outbreaks  H/O cervical and vaginal dysplasia. H/O hysterectomy, upper vaginectomy.  P:   Pap from vaginal apex  Refill for valtrex sent  Mammogram just done  Dexa with Dr Chalmers Cater  Colonoscopy UTD

## 2019-11-13 ENCOUNTER — Ambulatory Visit: Payer: Medicare HMO | Admitting: Certified Nurse Midwife

## 2019-11-13 DIAGNOSIS — Z1231 Encounter for screening mammogram for malignant neoplasm of breast: Secondary | ICD-10-CM | POA: Diagnosis not present

## 2019-11-14 ENCOUNTER — Other Ambulatory Visit (HOSPITAL_COMMUNITY)
Admission: RE | Admit: 2019-11-14 | Discharge: 2019-11-14 | Disposition: A | Discharge status: Home / Self Care | Payer: Medicare HMO | Source: Ambulatory Visit | Attending: Obstetrics and Gynecology | Admitting: Obstetrics and Gynecology

## 2019-11-14 ENCOUNTER — Other Ambulatory Visit: Payer: Self-pay

## 2019-11-14 ENCOUNTER — Ambulatory Visit (INDEPENDENT_AMBULATORY_CARE_PROVIDER_SITE_OTHER): Payer: Medicare HMO | Admitting: Obstetrics and Gynecology

## 2019-11-14 ENCOUNTER — Encounter: Payer: Self-pay | Admitting: Obstetrics and Gynecology

## 2019-11-14 VITALS — BP 122/68 | HR 70 | Ht 63.0 in | Wt 103.8 lb

## 2019-11-14 DIAGNOSIS — Z8619 Personal history of other infectious and parasitic diseases: Secondary | ICD-10-CM | POA: Diagnosis not present

## 2019-11-14 DIAGNOSIS — Z87411 Personal history of vaginal dysplasia: Secondary | ICD-10-CM | POA: Insufficient documentation

## 2019-11-14 DIAGNOSIS — Z87410 Personal history of cervical dysplasia: Secondary | ICD-10-CM | POA: Diagnosis not present

## 2019-11-14 DIAGNOSIS — Z1272 Encounter for screening for malignant neoplasm of vagina: Secondary | ICD-10-CM

## 2019-11-14 DIAGNOSIS — Z01419 Encounter for gynecological examination (general) (routine) without abnormal findings: Secondary | ICD-10-CM | POA: Diagnosis not present

## 2019-11-14 DIAGNOSIS — R87613 High grade squamous intraepithelial lesion on cytologic smear of cervix (HGSIL): Secondary | ICD-10-CM | POA: Diagnosis not present

## 2019-11-14 MED ORDER — VALACYCLOVIR HCL 500 MG PO TABS: ORAL_TABLET | ORAL | 1 refills | Status: DC

## 2019-11-14 NOTE — Patient Instructions (Signed)
EXERCISE AND DIET:  We recommended that you start or continue a regular exercise program for good health. Regular exercise means any activity that makes your heart beat faster and makes you sweat.  We recommend exercising at least 30 minutes per day at least 3 days a week, preferably 4 or 5.  We also recommend a diet low in fat and sugar.  Inactivity, poor dietary choices and obesity can cause diabetes, heart attack, stroke, and kidney damage, among others.    ALCOHOL AND SMOKING:  Women should limit their alcohol intake to no more than 7 drinks/beers/glasses of wine (combined, not each!) per week. Moderation of alcohol intake to this level decreases your risk of breast cancer and liver damage. And of course, no recreational drugs are part of a healthy lifestyle.  And absolutely no smoking or even second hand smoke. Most people know smoking can cause heart and lung diseases, but did you know it also contributes to weakening of your bones? Aging of your skin?  Yellowing of your teeth and nails?  CALCIUM AND VITAMIN D:  Adequate intake of calcium and Vitamin D are recommended.  The recommendations for exact amounts of these supplements seem to change often, but generally speaking 1,200 mg of calcium (between diet and supplement) and 800 units of Vitamin D per day seems prudent. Certain women may benefit from higher intake of Vitamin D.  If you are among these women, your doctor will have told you during your visit.    PAP SMEARS:  Pap smears, to check for cervical cancer or precancers,  have traditionally been done yearly, although recent scientific advances have shown that most women can have pap smears less often.  However, every woman still should have a physical exam from her gynecologist every year. It will include a breast check, inspection of the vulva and vagina to check for abnormal growths or skin changes, a visual exam of the cervix, and then an exam to evaluate the size and shape of the uterus and  ovaries.  And after 74 years of age, a rectal exam is indicated to check for rectal cancers. We will also provide age appropriate advice regarding health maintenance, like when you should have certain vaccines, screening for sexually transmitted diseases, bone density testing, colonoscopy, mammograms, etc.   MAMMOGRAMS:  All women over 40 years old should have a yearly mammogram. Many facilities now offer a "3D" mammogram, which may cost around $50 extra out of pocket. If possible,  we recommend you accept the option to have the 3D mammogram performed.  It both reduces the number of women who will be called back for extra views which then turn out to be normal, and it is better than the routine mammogram at detecting truly abnormal areas.    COLON CANCER SCREENING: Now recommend starting at age 45. At this time colonoscopy is not covered for routine screening until 50. There are take home tests that can be done between 45-49.   COLONOSCOPY:  Colonoscopy to screen for colon cancer is recommended for all women at age 50.  We know, you hate the idea of the prep.  We agree, BUT, having colon cancer and not knowing it is worse!!  Colon cancer so often starts as a polyp that can be seen and removed at colonscopy, which can quite literally save your life!  And if your first colonoscopy is normal and you have no family history of colon cancer, most women don't have to have it again for   10 years.  Once every ten years, you can do something that may end up saving your life, right?  We will be happy to help you get it scheduled when you are ready.  Be sure to check your insurance coverage so you understand how much it will cost.  It may be covered as a preventative service at no cost, but you should check your particular policy.      Breast Self-Awareness Breast self-awareness means being familiar with how your breasts look and feel. It involves checking your breasts regularly and reporting any changes to your  health care provider. Practicing breast self-awareness is important. A change in your breasts can be a sign of a serious medical problem. Being familiar with how your breasts look and feel allows you to find any problems early, when treatment is more likely to be successful. All women should practice breast self-awareness, including women who have had breast implants. How to do a breast self-exam One way to learn what is normal for your breasts and whether your breasts are changing is to do a breast self-exam. To do a breast self-exam: Look for Changes  1. Remove all the clothing above your waist. 2. Stand in front of a mirror in a room with good lighting. 3. Put your hands on your hips. 4. Push your hands firmly downward. 5. Compare your breasts in the mirror. Look for differences between them (asymmetry), such as: ? Differences in shape. ? Differences in size. ? Puckers, dips, and bumps in one breast and not the other. 6. Look at each breast for changes in your skin, such as: ? Redness. ? Scaly areas. 7. Look for changes in your nipples, such as: ? Discharge. ? Bleeding. ? Dimpling. ? Redness. ? A change in position. Feel for Changes Carefully feel your breasts for lumps and changes. It is best to do this while lying on your back on the floor and again while sitting or standing in the shower or tub with soapy water on your skin. Feel each breast in the following way:  Place the arm on the side of the breast you are examining above your head.  Feel your breast with the other hand.  Start in the nipple area and make  inch (2 cm) overlapping circles to feel your breast. Use the pads of your three middle fingers to do this. Apply light pressure, then medium pressure, then firm pressure. The light pressure will allow you to feel the tissue closest to the skin. The medium pressure will allow you to feel the tissue that is a little deeper. The firm pressure will allow you to feel the tissue  close to the ribs.  Continue the overlapping circles, moving downward over the breast until you feel your ribs below your breast.  Move one finger-width toward the center of the body. Continue to use the  inch (2 cm) overlapping circles to feel your breast as you move slowly up toward your collarbone.  Continue the up and down exam using all three pressures until you reach your armpit.  Write Down What You Find  Write down what is normal for each breast and any changes that you find. Keep a written record with breast changes or normal findings for each breast. By writing this information down, you do not need to depend only on memory for size, tenderness, or location. Write down where you are in your menstrual cycle, if you are still menstruating. If you are having trouble noticing differences   in your breasts, do not get discouraged. With time you will become more familiar with the variations in your breasts and more comfortable with the exam. How often should I examine my breasts? Examine your breasts every month. If you are breastfeeding, the best time to examine your breasts is after a feeding or after using a breast pump. If you menstruate, the best time to examine your breasts is 5-7 days after your period is over. During your period, your breasts are lumpier, and it may be more difficult to notice changes. When should I see my health care provider? See your health care provider if you notice:  A change in shape or size of your breasts or nipples.  A change in the skin of your breast or nipples, such as a reddened or scaly area.  Unusual discharge from your nipples.  A lump or thick area that was not there before.  Pain in your breasts.  Anything that concerns you.  

## 2019-11-18 LAB — CYTOLOGY - PAP
Comment: NEGATIVE
Diagnosis: HIGH — AB
High risk HPV: POSITIVE — AB

## 2019-11-19 ENCOUNTER — Other Ambulatory Visit: Payer: Self-pay | Admitting: *Deleted

## 2019-11-19 ENCOUNTER — Telehealth: Payer: Self-pay | Admitting: Obstetrics and Gynecology

## 2019-11-19 DIAGNOSIS — Z87410 Personal history of cervical dysplasia: Secondary | ICD-10-CM

## 2019-11-19 DIAGNOSIS — B977 Papillomavirus as the cause of diseases classified elsewhere: Secondary | ICD-10-CM

## 2019-11-19 DIAGNOSIS — R87623 High grade squamous intraepithelial lesion on cytologic smear of vagina (HGSIL): Secondary | ICD-10-CM

## 2019-11-19 DIAGNOSIS — Z87411 Personal history of vaginal dysplasia: Secondary | ICD-10-CM

## 2019-11-19 NOTE — Telephone Encounter (Signed)
Call placed to convey benefits. Spoke with the patient and conveyed the benefits. Patient understands/agreeable with the benefits. Patient is aware of the cancellation policy. Appointment scheduled 11/26/19.

## 2019-11-25 ENCOUNTER — Other Ambulatory Visit: Payer: Self-pay | Admitting: Obstetrics and Gynecology

## 2019-11-25 DIAGNOSIS — Z8619 Personal history of other infectious and parasitic diseases: Secondary | ICD-10-CM

## 2019-11-26 ENCOUNTER — Telehealth: Payer: Self-pay | Admitting: Obstetrics and Gynecology

## 2019-11-26 ENCOUNTER — Ambulatory Visit: Payer: Self-pay | Admitting: Obstetrics and Gynecology

## 2019-11-26 NOTE — Telephone Encounter (Signed)
Spoke with pt. Pt has COLPO scheduled today with Dr Talbert Nan at 4 pm. Pt asking about copay for today's procedure. Pt given PR$ 25. Pt agreeable and verbalized understanding.  Encounter closed.

## 2019-11-26 NOTE — Telephone Encounter (Signed)
Patient has a colpo scheduled this afternoon and asked to talk with a nurse.

## 2019-11-27 ENCOUNTER — Other Ambulatory Visit (HOSPITAL_COMMUNITY)
Admission: RE | Admit: 2019-11-27 | Discharge: 2019-11-27 | Disposition: A | Discharge status: Home / Self Care | Payer: Medicare HMO | Source: Ambulatory Visit | Attending: Obstetrics and Gynecology | Admitting: Obstetrics and Gynecology

## 2019-11-27 ENCOUNTER — Encounter: Payer: Self-pay | Admitting: Obstetrics and Gynecology

## 2019-11-27 ENCOUNTER — Other Ambulatory Visit: Payer: Self-pay

## 2019-11-27 ENCOUNTER — Ambulatory Visit: Payer: Medicare HMO | Admitting: Obstetrics and Gynecology

## 2019-11-27 DIAGNOSIS — R69 Illness, unspecified: Secondary | ICD-10-CM | POA: Diagnosis not present

## 2019-11-27 DIAGNOSIS — B977 Papillomavirus as the cause of diseases classified elsewhere: Secondary | ICD-10-CM

## 2019-11-27 DIAGNOSIS — N952 Postmenopausal atrophic vaginitis: Secondary | ICD-10-CM | POA: Diagnosis not present

## 2019-11-27 DIAGNOSIS — R87623 High grade squamous intraepithelial lesion on cytologic smear of vagina (HGSIL): Secondary | ICD-10-CM | POA: Diagnosis not present

## 2019-11-27 DIAGNOSIS — Z87410 Personal history of cervical dysplasia: Secondary | ICD-10-CM

## 2019-11-27 DIAGNOSIS — Z87411 Personal history of vaginal dysplasia: Secondary | ICD-10-CM | POA: Diagnosis present

## 2019-11-27 NOTE — Progress Notes (Signed)
GYNECOLOGY  VISIT   HPI: 74 y.o.   Divorced Asian Not Hispanic or Latino  female   [phone] with Patient's last menstrual period was 03/22/2003 (exact date).   here for evaluation of an abnormal pap smear.    S/P robotic assisted modified radical hysterectomy and upper vaginectomy on 5/19 with Dr Lianne Bushy. Pathology with CIN III, negative vaginal margins.  In 8/20 she had a normal pap. Recent pap returned with HSIL, +HPV.   GYNECOLOGIC HISTORY: Patient's last menstrual period was 03/22/2003 (exact date). Contraception:none  Menopausal hormone therapy: none         OB History    Gravida  2   Para  2   Term  2   Preterm      AB      Living  2     SAB      TAB      Ectopic      Multiple      Live Births  2              Patient Active Problem List   Diagnosis Date Noted  . VAIN III (vaginal intraepithelial neoplasia grade III) 08/15/2017  . CIN III (cervical intraepithelial neoplasia grade III) with severe dysplasia 08/15/2017  . CIN III with severe dysplasia 08/15/2017  . History of abnormal cervical Pap smear 11/08/2016  . Nail fungus 06/22/2012    Past Medical History:  Diagnosis Date  . Abnormal Pap smear of cervix 09/30/2013   neg pap HPV HR +, 8-10-16ASCUS HPV HR+, 8/17 neg HPV HR+  . Bilateral artificial lens implant   . Hypertension   . Osteoporosis   . STD (sexually transmitted disease)    HSV    Past Surgical History:  Procedure Laterality Date  . Base Wedge Osteomy Right 03/10/10   right foot  . BUNIONECTOMY Right 03/08/12   Keller right foot  . Capsulotomy IPJ Right 03/08/12   Right foot 2  . CATARACT EXTRACTION     both eyes  . COLONOSCOPY WITH PROPOFOL N/A 07/08/2014   Procedure: COLONOSCOPY WITH PROPOFOL;  Surgeon: Garlan Fair, MD;  Location: [facility];  Service: Endoscopy;  Laterality: N/A;  . COLPOSCOPY     2015, ECC neg, polyp neg, 2016 & 2017  . Thedora Hinders w/graft Right 03/10/10   right foot  . FOOT SURGERY Right  2000, [id number]  . FOOT SURGERY Left 2002, 2006, 2010  . Hammer toe repair Right 03/10/10   right foot # 5  . Hammer toe repair Left 03/02/11   left foot 3 . 5  . MANDIBLE FRACTURE SURGERY    . ROBOTIC ASSISTED TOTAL HYSTERECTOMY N/A 08/15/2017   Procedure: XI ROBOTIC ASSISTED MODIFIED RADICAL TOTAL HYSTERECTOMY WITH UPPER VAGINECTOMY;  Surgeon: Everitt Amber, MD;  Location: [facility];  Service: Gynecology;  Laterality: N/A;  . TUBAL LIGATION      Current Outpatient Medications  Medication Sig Dispense Refill  . CALCIUM PO Take 1 tablet by mouth 2 (two) times daily. 500 mg per    . Cholecalciferol (VITAMIN D) 2000 units tablet Take 2,000 Int'l Units by mouth daily.     Marland Kitchen lisinopril (PRINIVIL,ZESTRIL) 10 MG tablet Take 10 mg by mouth every morning.     . Multiple Vitamin (MULTIVITAMIN) tablet Take 1 tablet by mouth daily.    . valACYclovir (VALTREX) 500 MG tablet Take one tablet po BID x 3 days prn 30 tablet 1  . Vitamin D, Ergocalciferol, (DRISDOL) 50000 UNITS CAPS Take  50,000 Units by mouth every 30 (thirty) days.     No current facility-administered medications for this visit.     ALLERGIES: Penicillins  Family History  Problem Relation Age of Onset  . Hypertension Sister   . Hypertension Brother   . Hypertension Mother     Social History   Socioeconomic History  . Marital status: Divorced    Spouse name: Not on file  . Number of children: Not on file  . Years of education: Not on file  . Highest education level: Not on file  Occupational History  . Not on file  Tobacco Use  . Smoking status: Never Smoker  . Smokeless tobacco: Never Used  Vaping Use  . Vaping Use: Never used  Substance and Sexual Activity  . Alcohol use: No  . Drug use: No  . Sexual activity: Not Currently    Birth control/protection: Surgical    Comment: BTL, hysterectomy  Other Topics Concern  . Not on file  Social History Narrative  . Not on file   Social Determinants of Health   Financial  Resource Strain:   . Difficulty of Paying Living Expenses: Not on file  Food Insecurity:   . Worried About Charity fundraiser in the Last Year: Not on file  . Ran Out of Food in the Last Year: Not on file  Transportation Needs:   . Lack of Transportation (Medical): Not on file  . Lack of Transportation (Non-Medical): Not on file  Physical Activity:   . Days of Exercise per Week: Not on file  . Minutes of Exercise per Session: Not on file  Stress:   . Feeling of Stress : Not on file  Social Connections:   . Frequency of Communication with Friends and Family: Not on file  . Frequency of Social Gatherings with Friends and Family: Not on file  . Attends Religious Services: Not on file  . Active Member of Clubs or Organizations: Not on file  . Attends Archivist Meetings: Not on file  . Marital Status: Not on file  Intimate Partner Violence:   . Fear of Current or Ex-Partner: Not on file  . Emotionally Abused: Not on file  . Physically Abused: Not on file  . Sexually Abused: Not on file    Review of Systems  All other systems reviewed and are negative.   PHYSICAL EXAMINATION:    LMP 03/22/2003 (Exact Date)     General appearance: alert, cooperative and appears stated age  Pelvic: External genitalia:  no lesions              Urethra:  normal appearing urethra with no masses, tenderness or lesions              Bartholins and Skenes: normal                 Vagina: short, narrow, atrophic appearing vagina with normal color and discharge, no lesions              Cervix: absent  Colposcopy of the vagina was done: no aceto-white changes, fairly diffuse decrease in lugols uptake. 2 biopsies taken from the vaginal apex.   Chaperone was present for exam.  ASSESSMENT H/O vaginal and cervical dysplasia. S/P robotic assisted modified radical hysterectomy and upper vaginectomy on 5/19 with Dr Lianne Bushy. Pathology with CIN III, negative vaginal margins.  Recent pap from vaginal  apex with HSIL, +HPV The patient has a very small, shortened vagina  PLAN Colposcopy of the vagina with biopsies was done Further plans after results return   An After Visit Summary was printed and given to the patient.

## 2019-11-27 NOTE — Patient Instructions (Signed)

## 2019-12-04 LAB — SURGICAL PATHOLOGY

## 2019-12-05 ENCOUNTER — Telehealth: Payer: Self-pay | Admitting: *Deleted

## 2019-12-05 NOTE — Telephone Encounter (Signed)
Burnice Logan, RN  12/05/2019 10:32 AM EDT Back to Top    Left message to call Sharee Pimple, RN at [facility].

## 2019-12-05 NOTE — Telephone Encounter (Signed)
Patient is returning call.  °

## 2019-12-05 NOTE — Telephone Encounter (Signed)
Spoke with patient. Advised as seen below per Dr. Talbert Nan. Patient agreeable to proceed with consult with Dr. Denman George, request to schedule 3-4 weeks out due to her work schedule. Advised patient I will contact Dr. Serita Grit office to schedule and f/u with appt details, patient agreeable.    Message to [facility] for scheduling.

## 2019-12-05 NOTE — Telephone Encounter (Signed)
-----   Message from Salvadore Dom, MD sent at 12/05/2019  9:25 AM EDT ----- Please let the patient know that her vaginal biopsy doesn't match her pap of HSIL. She has a h/o a robotic assisted modified radical hysterectomy and upper vaginectomy on 5/19 with Dr Lianne Bushy. Pathology with CIN III, negative vaginal margins. She has a short, atrophic small vagina. She had a She needs to go back to GYN Oncology for further evaluation and treatment.

## 2019-12-10 NOTE — Telephone Encounter (Signed)
Late Entry: No appt scheduled to date. Followed-up with [facility] on 12/09/19, was advised waiting to review with Dr. Denman George.

## 2019-12-11 ENCOUNTER — Telehealth: Payer: Self-pay | Admitting: *Deleted

## 2019-12-11 NOTE — Telephone Encounter (Signed)
Called and scheduled the patient for a follow up with Dr Denman George  appt in 3-4 weeks after Dr Talbert Nan appt

## 2019-12-11 NOTE — Telephone Encounter (Signed)
Per review of Epic, patient is scheduled to see Dr. Denman George on 12/31/19.   Encounter closed.

## 2019-12-26 ENCOUNTER — Telehealth: Payer: Self-pay | Admitting: *Deleted

## 2019-12-26 NOTE — Telephone Encounter (Signed)
Patient called and canceled her appt for next week. Patient stated "Money is real tight right now, I can't come. I will call back once money is better to reschedule."

## 2019-12-31 ENCOUNTER — Ambulatory Visit: Payer: Medicare HMO | Admitting: Gynecologic Oncology

## 2020-03-16 DIAGNOSIS — R600 Localized edema: Secondary | ICD-10-CM | POA: Diagnosis not present

## 2020-03-16 DIAGNOSIS — L03116 Cellulitis of left lower limb: Secondary | ICD-10-CM | POA: Diagnosis not present

## 2020-03-19 DIAGNOSIS — M7989 Other specified soft tissue disorders: Secondary | ICD-10-CM | POA: Diagnosis not present

## 2020-03-19 DIAGNOSIS — L03116 Cellulitis of left lower limb: Secondary | ICD-10-CM | POA: Diagnosis not present

## 2020-03-26 ENCOUNTER — Other Ambulatory Visit (HOSPITAL_COMMUNITY): Payer: Self-pay | Admitting: Internal Medicine

## 2020-03-26 DIAGNOSIS — M7989 Other specified soft tissue disorders: Secondary | ICD-10-CM | POA: Diagnosis not present

## 2020-03-26 DIAGNOSIS — R7989 Other specified abnormal findings of blood chemistry: Secondary | ICD-10-CM | POA: Diagnosis not present

## 2020-03-26 DIAGNOSIS — R8290 Unspecified abnormal findings in urine: Secondary | ICD-10-CM | POA: Diagnosis not present

## 2020-04-01 ENCOUNTER — Other Ambulatory Visit (HOSPITAL_COMMUNITY): Payer: Medicare HMO

## 2020-04-01 ENCOUNTER — Ambulatory Visit (HOSPITAL_COMMUNITY): Payer: Medicare HMO | Attending: Internal Medicine

## 2020-04-01 ENCOUNTER — Other Ambulatory Visit: Payer: Self-pay

## 2020-04-01 DIAGNOSIS — R600 Localized edema: Secondary | ICD-10-CM

## 2020-04-01 DIAGNOSIS — M7989 Other specified soft tissue disorders: Secondary | ICD-10-CM | POA: Insufficient documentation

## 2020-04-01 DIAGNOSIS — I081 Rheumatic disorders of both mitral and tricuspid valves: Secondary | ICD-10-CM | POA: Diagnosis not present

## 2020-04-01 LAB — ECHOCARDIOGRAM COMPLETE
Area-P 1/2: 3.93 cm2
S' Lateral: 2.5 cm

## 2020-04-13 ENCOUNTER — Ambulatory Visit
Admission: RE | Admit: 2020-04-13 | Discharge: 2020-04-13 | Disposition: A | Discharge status: Home / Self Care | Payer: Medicare HMO | Source: Ambulatory Visit | Attending: Internal Medicine | Admitting: Internal Medicine

## 2020-04-13 ENCOUNTER — Other Ambulatory Visit: Payer: Self-pay | Admitting: Internal Medicine

## 2020-04-13 DIAGNOSIS — R0600 Dyspnea, unspecified: Secondary | ICD-10-CM | POA: Diagnosis not present

## 2020-04-13 DIAGNOSIS — J449 Chronic obstructive pulmonary disease, unspecified: Secondary | ICD-10-CM | POA: Diagnosis not present

## 2020-04-13 DIAGNOSIS — R0609 Other forms of dyspnea: Secondary | ICD-10-CM

## 2020-04-13 DIAGNOSIS — R609 Edema, unspecified: Secondary | ICD-10-CM | POA: Diagnosis not present

## 2020-04-13 DIAGNOSIS — R0602 Shortness of breath: Secondary | ICD-10-CM | POA: Diagnosis not present

## 2020-04-15 DIAGNOSIS — M810 Age-related osteoporosis without current pathological fracture: Secondary | ICD-10-CM | POA: Diagnosis not present

## 2020-04-16 ENCOUNTER — Other Ambulatory Visit (HOSPITAL_COMMUNITY): Payer: Medicare HMO

## 2020-04-23 DIAGNOSIS — R609 Edema, unspecified: Secondary | ICD-10-CM | POA: Diagnosis not present

## 2020-04-23 DIAGNOSIS — M810 Age-related osteoporosis without current pathological fracture: Secondary | ICD-10-CM | POA: Diagnosis not present

## 2020-04-23 DIAGNOSIS — Z1389 Encounter for screening for other disorder: Secondary | ICD-10-CM | POA: Diagnosis not present

## 2020-04-23 DIAGNOSIS — I10 Essential (primary) hypertension: Secondary | ICD-10-CM | POA: Diagnosis not present

## 2020-04-23 DIAGNOSIS — Z0000 Encounter for general adult medical examination without abnormal findings: Secondary | ICD-10-CM | POA: Diagnosis not present

## 2020-04-23 DIAGNOSIS — R0600 Dyspnea, unspecified: Secondary | ICD-10-CM | POA: Diagnosis not present

## 2020-04-29 ENCOUNTER — Other Ambulatory Visit: Payer: Self-pay | Admitting: Internal Medicine

## 2020-04-29 DIAGNOSIS — R0609 Other forms of dyspnea: Secondary | ICD-10-CM

## 2020-04-29 DIAGNOSIS — R0600 Dyspnea, unspecified: Secondary | ICD-10-CM

## 2020-05-01 ENCOUNTER — Other Ambulatory Visit (HOSPITAL_COMMUNITY)
Admission: RE | Admit: 2020-05-01 | Discharge: 2020-05-01 | Disposition: A | Discharge status: Home / Self Care | Payer: Medicare HMO | Source: Ambulatory Visit | Attending: Internal Medicine | Admitting: Internal Medicine

## 2020-05-01 ENCOUNTER — Other Ambulatory Visit: Payer: Self-pay | Admitting: Obstetrics and Gynecology

## 2020-05-01 DIAGNOSIS — Z8619 Personal history of other infectious and parasitic diseases: Secondary | ICD-10-CM

## 2020-05-01 DIAGNOSIS — Z20822 Contact with and (suspected) exposure to covid-19: Secondary | ICD-10-CM | POA: Diagnosis not present

## 2020-05-01 DIAGNOSIS — Z01812 Encounter for preprocedural laboratory examination: Secondary | ICD-10-CM | POA: Insufficient documentation

## 2020-05-01 LAB — SARS CORONAVIRUS 2 (TAT 6-24 HRS): SARS Coronavirus 2: NEGATIVE

## 2020-05-01 NOTE — Telephone Encounter (Signed)
Annual exam due in 8/22 last filled on 8/21 #30 with 1 refill

## 2020-05-04 ENCOUNTER — Ambulatory Visit: Payer: Medicare HMO | Admitting: Internal Medicine

## 2020-05-04 ENCOUNTER — Other Ambulatory Visit: Payer: Self-pay

## 2020-05-04 DIAGNOSIS — R0600 Dyspnea, unspecified: Secondary | ICD-10-CM

## 2020-05-04 DIAGNOSIS — R0609 Other forms of dyspnea: Secondary | ICD-10-CM

## 2020-05-04 LAB — PULMONARY FUNCTION TEST
DL/VA % pred: 98 %
DL/VA: 4.6 ml/min/mmHg/L
DLCO cor % pred: 81 %
DLCO cor: 18.58 ml/min/mmHg
DLCO unc % pred: 81 %
DLCO unc: 18.58 ml/min/mmHg
FEF 25-75 Post: 2 L/sec
FEF 25-75 Pre: 2.43 L/sec
FEF2575-%Change-Post: -17 %
FEF2575-%Pred-Post: 124 %
FEF2575-%Pred-Pre: 151 %
FEV1-%Change-Post: 5 %
FEV1-%Pred-Post: 111 %
FEV1-%Pred-Pre: 105 %
FEV1-Post: 2.1 L
FEV1-Pre: 1.99 L
FEV1FVC-%Change-Post: -1 %
FEV1FVC-%Pred-Pre: 102 %
FEV6-%Change-Post: 16 %
FEV6-Post: 2.63 L
FEV6-Pre: 2.26 L
FVC-%Change-Post: 6 %
FVC-%Pred-Post: 108 %
FVC-%Pred-Pre: 101 %
FVC-Post: 2.63 L
FVC-Pre: 2.46 L
Post FEV1/FVC ratio: 80 %
Post FEV6/FVC ratio: 100 %
Pre FEV1/FVC ratio: 81 %
Pre FEV6/FVC Ratio: 100 %
RV % pred: 188 %
RV: 4.17 L
TLC % pred: 130 %
TLC: 6.4 L

## 2020-05-04 NOTE — Progress Notes (Signed)
Full PFT performed today. °

## 2020-05-19 ENCOUNTER — Other Ambulatory Visit: Payer: Self-pay | Admitting: Obstetrics and Gynecology

## 2020-05-19 DIAGNOSIS — Z8619 Personal history of other infectious and parasitic diseases: Secondary | ICD-10-CM

## 2020-05-29 DIAGNOSIS — H524 Presbyopia: Secondary | ICD-10-CM | POA: Diagnosis not present

## 2020-07-20 DIAGNOSIS — M810 Age-related osteoporosis without current pathological fracture: Secondary | ICD-10-CM | POA: Diagnosis not present

## 2020-07-20 DIAGNOSIS — E559 Vitamin D deficiency, unspecified: Secondary | ICD-10-CM | POA: Diagnosis not present

## 2020-07-22 ENCOUNTER — Telehealth: Payer: Self-pay

## 2020-07-22 NOTE — Telephone Encounter (Signed)
Dr. Talbert Nan wanted Renee Watkins to see Dr. Denman George after colposcopy/ biopsy on 11-27-19. Patient Cancelled appointment for 12-31-19 d/t financial reasons.  She is now call to r/s that follow up appointment.

## 2020-07-27 ENCOUNTER — Telehealth: Payer: Self-pay | Admitting: *Deleted

## 2020-07-27 DIAGNOSIS — E559 Vitamin D deficiency, unspecified: Secondary | ICD-10-CM | POA: Diagnosis not present

## 2020-07-27 DIAGNOSIS — I10 Essential (primary) hypertension: Secondary | ICD-10-CM | POA: Diagnosis not present

## 2020-07-27 DIAGNOSIS — M810 Age-related osteoporosis without current pathological fracture: Secondary | ICD-10-CM | POA: Diagnosis not present

## 2020-07-27 NOTE — Telephone Encounter (Signed)
Scheduled the patient for a follow appt with Dr Denman George for 5/17

## 2020-08-03 ENCOUNTER — Encounter: Payer: Self-pay | Admitting: Gynecologic Oncology

## 2020-08-04 ENCOUNTER — Other Ambulatory Visit: Payer: Self-pay

## 2020-08-04 ENCOUNTER — Encounter: Payer: Self-pay | Admitting: Gynecologic Oncology

## 2020-08-04 ENCOUNTER — Inpatient Hospital Stay: Payer: Medicare HMO | Attending: Gynecologic Oncology | Admitting: Gynecologic Oncology

## 2020-08-04 VITALS — BP 150/53 | HR 84 | Temp 97.1°F | Resp 20 | Ht 62.0 in | Wt 108.0 lb

## 2020-08-04 DIAGNOSIS — D069 Carcinoma in situ of cervix, unspecified: Secondary | ICD-10-CM | POA: Diagnosis not present

## 2020-08-04 DIAGNOSIS — R69 Illness, unspecified: Secondary | ICD-10-CM | POA: Diagnosis not present

## 2020-08-04 DIAGNOSIS — B977 Papillomavirus as the cause of diseases classified elsewhere: Secondary | ICD-10-CM

## 2020-08-04 DIAGNOSIS — Z90722 Acquired absence of ovaries, bilateral: Secondary | ICD-10-CM | POA: Diagnosis not present

## 2020-08-04 DIAGNOSIS — Z9079 Acquired absence of other genital organ(s): Secondary | ICD-10-CM | POA: Diagnosis not present

## 2020-08-04 DIAGNOSIS — Z79899 Other long term (current) drug therapy: Secondary | ICD-10-CM | POA: Insufficient documentation

## 2020-08-04 DIAGNOSIS — R87613 High grade squamous intraepithelial lesion on cytologic smear of cervix (HGSIL): Secondary | ICD-10-CM

## 2020-08-04 DIAGNOSIS — Z79890 Hormone replacement therapy (postmenopausal): Secondary | ICD-10-CM | POA: Insufficient documentation

## 2020-08-04 DIAGNOSIS — N72 Inflammatory disease of cervix uteri: Secondary | ICD-10-CM

## 2020-08-04 DIAGNOSIS — Z90710 Acquired absence of both cervix and uterus: Secondary | ICD-10-CM | POA: Diagnosis not present

## 2020-08-04 DIAGNOSIS — A630 Anogenital (venereal) warts: Secondary | ICD-10-CM | POA: Insufficient documentation

## 2020-08-04 MED ORDER — ESTRADIOL 0.1 MG/GM VA CREA: 1.0000 | TOPICAL_CREAM | Freq: Every day | VAGINAL | 12 refills | Status: DC

## 2020-08-04 NOTE — Patient Instructions (Addendum)
Dr Denman George recommendation is to use vaginal estrogen cream for 6 months. You insert the applicator into the vagina (deep, not on the surface of the lips of the vagina) and do this every night before going to sleep.  Dr Denman George will see you back for a repeat pap smear in November.

## 2020-08-04 NOTE — Progress Notes (Signed)
Follow-up Note: Gyn-Onc  Consult was requested by Dr. Talbert Nan for the evaluation of Renee Watkins 75 y.o. female  CC:  Chief Complaint  Patient presents with  . HSIL (high grade squamous intraepithelial lesion) on Pap sm  . High risk human papilloma virus (HPV) infection of cervix    Assessment/Plan:  Ms. Renee Watkins  is a 75 y.o.  year old with history of CIN III, s/p hysterectomy and upper vaginectomy (with BSO) on 08/15/17. Now with VAIN 3.  Her exam is normal including on colposcopic evaluation. I do not see a lesion which is amenable to resection vs laser. Therefore, we will attempt treatment with 6 months of vaginal estrogen.  She will see me back at that time (November, 2022) for repeat pap. If it remains high grade dysplasia, I would consider her for either trial of 5-FU or laser of the entire vaginal mucosa. If the pap at that time shows regression, she will follow-up with Dr Talbert Nan again for annual paps.  HPI: Renee Watkins is a 75 year old woman who is seen in consultation at the request of Dr Talbert Nan for vaginal dysplasia and agglutination.  The patient has a long standing history of abnormal paps and high risk HPV.  She had a pap in August 2016 which showed ASCUS with high risk HPV positive. Cytology was negaive in 2017 but high risk HPV remained present.  On 11/08/16 she underwent a pap which showed ASC-H with positive high risk HPV. Colposcopy of the vagina on 11/23/16 showed an agglutinated upper vagina and the cervix could not be visualized. However, there was an area of increased pigmentation of the vagina just under the fusion at 6 o'clock and a biopsy was taken. There was decreased lugols staining throughout the vagina and a biopsy was taken at 12 o'clock.  These biopsies revealed "CIN 2-3".  A TVUS was obtained on 12/20/16 to identify the cervix as none could be seen on exam. The US showed a 6.8x3.6x3.2cm uterus with a thin endometrium. The cervix was grossly normal on  [place].   She was treated with 5-FU vaginally for 2 months in December and January, 2019. She then underwent repeat pap on 06/15/17 which showed VAIN/CIN 2-3 with high risk HPV detected.   She underwent robotic assisted modified radical (Type II) hysterectomy and upper vaginectomy on 08/15/17. Final pathology showed a focal high grade squamous intrapeithelial lesions (CIN III) in the cervix, but negative vaginal margins. The ovaries and tubes were benign.  Interval Hx:  Miyani had follow-up with Dr Talbert Nan, initially having normal paps after her procedure.  Vaginal pap on 11/14/19 showed HGSIL.  Vaginal biopsy on 11/27/19 showed atrophic squamous mucosa.  No interventions took place after these results.   She returned to see me today for follow-up.   Current Meds:  Outpatient Encounter Medications as of 08/04/2020  Medication Sig  . CALCIUM PO Take 1 tablet by mouth 2 (two) times daily. 500 mg per  . Cholecalciferol (VITAMIN D) 2000 units tablet Take 2,000 Int'l Units by mouth 3 (three) times a week.  . denosumab (PROLIA) 60 MG/ML SOSY injection Inject 60 mg into the skin every 6 (six) months.  Marland Kitchen estradiol (ESTRACE VAGINAL) 0.1 MG/GM vaginal cream Place 1 Applicatorful vaginally at bedtime.  . furosemide (LASIX) 40 MG tablet Take 40 mg by mouth daily.  Marland Kitchen KLOR-CON M10 10 MEQ tablet Take 10 mEq by mouth daily.  Marland Kitchen lisinopril (PRINIVIL,ZESTRIL) 10 MG tablet Take 10 mg by mouth every  morning.   . Multiple Vitamin (MULTIVITAMIN) tablet Take 1 tablet by mouth daily.  . Vitamin D, Ergocalciferol, (DRISDOL) 50000 UNITS CAPS Take 50,000 Units by mouth every 30 (thirty) days.  . valACYclovir (VALTREX) 500 MG tablet TAKE 1 TABLET BY MOUTH TWICE A DAY FOR 3 DAYS AS NEEDED (Patient not taking: Reported on 08/03/2020)   No facility-administered encounter medications on file as of 08/04/2020.    Allergy:  Allergies  Allergen Reactions  . Penicillins Anaphylaxis    Has patient had a PCN reaction causing  immediate rash, facial/tongue/throat swelling, SOB or lightheadedness with hypotension: yes Has patient had a PCN reaction causing severe rash involving mucus membranes or skin necrosis: no Has patient had a PCN reaction that required hospitalization: yes Has patient had a PCN reaction occurring within the last 10 years: no If all of the above answers are "NO", then may proceed with Cephalosporin use.     Social Hx:   Social History   Socioeconomic History  . Marital status: Divorced    Spouse name: Not on file  . Number of children: Not on file  . Years of education: Not on file  . Highest education level: Not on file  Occupational History  . Not on file  Tobacco Use  . Smoking status: Never Smoker  . Smokeless tobacco: Never Used  Vaping Use  . Vaping Use: Never used  Substance and Sexual Activity  . Alcohol use: No  . Drug use: No  . Sexual activity: Not Currently    Birth control/protection: Surgical    Comment: BTL, hysterectomy  Other Topics Concern  . Not on file  Social History Narrative  . Not on file   Social Determinants of Health   Financial Resource Strain: Not on file  Food Insecurity: Not on file  Transportation Needs: Not on file  Physical Activity: Not on file  Stress: Not on file  Social Connections: Not on file  Intimate Partner Violence: Not on file    Past Surgical Hx:  Past Surgical History:  Procedure Laterality Date  . Base Wedge Osteomy Right 03/10/10   right foot  . BUNIONECTOMY Right 03/08/12   Keller right foot  . Capsulotomy IPJ Right 03/08/12   Right foot 2  . CATARACT EXTRACTION     both eyes  . COLONOSCOPY WITH PROPOFOL N/A 07/08/2014   Procedure: COLONOSCOPY WITH PROPOFOL;  Surgeon: Garlan Fair, MD;  Location: [facility];  Service: Endoscopy;  Laterality: N/A;  . COLPOSCOPY     2015, ECC neg, polyp neg, 2016 & 2017  . Thedora Hinders w/graft Right 03/10/10   right foot  . FOOT SURGERY Right 2000, [id number]  . FOOT  SURGERY Left 2002, 2006, 2010  . Hammer toe repair Right 03/10/10   right foot # 5  . Hammer toe repair Left 03/02/11   left foot 3 . 5  . MANDIBLE FRACTURE SURGERY    . ROBOTIC ASSISTED TOTAL HYSTERECTOMY N/A 08/15/2017   Procedure: XI ROBOTIC ASSISTED MODIFIED RADICAL TOTAL HYSTERECTOMY WITH UPPER VAGINECTOMY;  Surgeon: Everitt Amber, MD;  Location: [facility];  Service: Gynecology;  Laterality: N/A;  . TUBAL LIGATION      Past Medical Hx:  Past Medical History:  Diagnosis Date  . Abnormal Pap smear of cervix 09/30/2013   neg pap HPV HR +, 8-10-16ASCUS HPV HR+, 8/17 neg HPV HR+  . Bilateral artificial lens implant   . Hypertension   . Osteoporosis   . STD (sexually transmitted disease)  HSV    Past Gynecological History:  + high risk HPV Patient's last menstrual period was 03/22/2003 (exact date).  Family Hx:  Family History  Problem Relation Age of Onset  . Hypertension Sister   . Leukemia Sister   . Hypertension Brother   . Hypertension Mother     Review of Systems:  Constitutional  Feels well,    ENT Normal appearing ears and nares bilaterally Skin/Breast  No rash, sores, jaundice, itching, dryness Cardiovascular  No chest pain, shortness of breath, or edema  Pulmonary  No cough or wheeze.  Gastro Intestinal  No nausea, vomitting, or diarrhoea. No bright red blood per rectum, no abdominal pain, change in bowel movement, or constipation.  Genito Urinary  No frequency, urgency, dysuria, no bleeding or discharge. Not sexually active Musculo Skeletal  No myalgia, arthralgia, joint swelling or pain  Neurologic  No weakness, numbness, change in gait,  Psychology  No depression, anxiety, insomnia.   Vitals:  Blood pressure (!) 150/53, pulse 84, temperature (!) 97.1 F (36.2 C), temperature source Tympanic, resp. rate 20, height 5\' 2"  (1.575 m), weight 108 lb (49 kg), last menstrual period 03/22/2003, SpO2 100 %.  Physical Exam: WD in NAD Neck  Supple NROM,  without any enlargements.  Lymph Node Survey No cervical supraclavicular or inguinal adenopathy Cardiovascular  Pulse normal rate, regularity and rhythm. S1 and S2 normal.  Lungs  Clear to auscultation bilateraly, without wheezes/crackles/rhonchi. Good air movement.  Skin  No rash/lesions/breakdown  Psychiatry  Alert and oriented to person, place, and time  Abdomen  Normoactive bowel sounds, abdomen soft, non-tender and very thin without evidence of hernia.  Back No CVA tenderness Genito Urinary  Vagina very shortened (3cm) . Colposcopy used with Lugols. No visible lesions seen and no non-staining lugols areas seen.  Rectal  Good tone, no masses no cul de sac nodularity.  Extremities  No bilateral cyanosis, clubbing or edema.   Thereasa Solo, MD  08/04/2020, 2:47 PM

## 2020-10-14 DIAGNOSIS — M810 Age-related osteoporosis without current pathological fracture: Secondary | ICD-10-CM | POA: Diagnosis not present

## 2020-11-18 ENCOUNTER — Ambulatory Visit: Payer: Medicare HMO | Admitting: Obstetrics and Gynecology

## 2020-11-18 DIAGNOSIS — Z1231 Encounter for screening mammogram for malignant neoplasm of breast: Secondary | ICD-10-CM | POA: Diagnosis not present

## 2020-12-25 ENCOUNTER — Telehealth: Payer: Self-pay | Admitting: *Deleted

## 2020-12-25 NOTE — Telephone Encounter (Signed)
Moved the patient's appt from 11/1 to 11/2  and changed provider from Dr Denman George to Dr Delsa Sale

## 2021-01-19 ENCOUNTER — Ambulatory Visit: Payer: Medicare HMO | Admitting: Gynecologic Oncology

## 2021-01-19 ENCOUNTER — Encounter: Payer: Self-pay | Admitting: Obstetrics & Gynecology

## 2021-01-20 ENCOUNTER — Other Ambulatory Visit (HOSPITAL_COMMUNITY)
Admission: RE | Admit: 2021-01-20 | Discharge: 2021-01-20 | Disposition: A | Discharge status: Home / Self Care | Payer: Medicare HMO | Source: Ambulatory Visit | Attending: Obstetrics & Gynecology | Admitting: Obstetrics & Gynecology

## 2021-01-20 ENCOUNTER — Inpatient Hospital Stay: Payer: Medicare HMO | Attending: Gynecologic Oncology | Admitting: Obstetrics & Gynecology

## 2021-01-20 ENCOUNTER — Other Ambulatory Visit: Payer: Self-pay

## 2021-01-20 VITALS — BP 139/59 | HR 105 | Temp 98.2°F | Resp 17 | Wt 109.6 lb

## 2021-01-20 DIAGNOSIS — I10 Essential (primary) hypertension: Secondary | ICD-10-CM | POA: Insufficient documentation

## 2021-01-20 DIAGNOSIS — Z9079 Acquired absence of other genital organ(s): Secondary | ICD-10-CM | POA: Diagnosis not present

## 2021-01-20 DIAGNOSIS — D072 Carcinoma in situ of vagina: Secondary | ICD-10-CM | POA: Insufficient documentation

## 2021-01-20 DIAGNOSIS — R87613 High grade squamous intraepithelial lesion on cytologic smear of cervix (HGSIL): Secondary | ICD-10-CM | POA: Diagnosis not present

## 2021-01-20 DIAGNOSIS — Z79890 Hormone replacement therapy (postmenopausal): Secondary | ICD-10-CM | POA: Diagnosis not present

## 2021-01-20 DIAGNOSIS — Z90722 Acquired absence of ovaries, bilateral: Secondary | ICD-10-CM | POA: Diagnosis not present

## 2021-01-20 DIAGNOSIS — M810 Age-related osteoporosis without current pathological fracture: Secondary | ICD-10-CM | POA: Insufficient documentation

## 2021-01-20 DIAGNOSIS — Z90710 Acquired absence of both cervix and uterus: Secondary | ICD-10-CM | POA: Diagnosis not present

## 2021-01-20 DIAGNOSIS — Z86001 Personal history of in-situ neoplasm of cervix uteri: Secondary | ICD-10-CM | POA: Insufficient documentation

## 2021-01-20 DIAGNOSIS — Z01419 Encounter for gynecological examination (general) (routine) without abnormal findings: Secondary | ICD-10-CM | POA: Insufficient documentation

## 2021-01-20 DIAGNOSIS — Z79899 Other long term (current) drug therapy: Secondary | ICD-10-CM | POA: Insufficient documentation

## 2021-01-20 NOTE — Progress Notes (Signed)
Follow Up Note: Gyn-Onc  Renee Watkins 75 y.o. female  CC: F/U visit   HPI: The dysplasia history was reviewed.  Interval History: Colposcopic exam at the last visit was negative.  She presents for a repeat Pap smear following vaginal E2 therapy x 6 mos.  She denies any vaginal bleeding, abdominal/pelvic pain, leg pain, urinary symptoms, cough or weight loss.    Review of Systems  Review of Systems  Constitutional:  Negative for malaise/fatigue and weight loss.  Respiratory:  Negative for shortness of breath and wheezing.   Cardiovascular:  Negative for chest pain and leg swelling.  Gastrointestinal:  Negative for abdominal pain, blood in stool, constipation, nausea and vomiting.  Genitourinary:  Negative for dysuria, frequency, hematuria and urgency.  Musculoskeletal:  Negative for joint pain and myalgias.  Neurological:  Negative for weakness.  Psychiatric/Behavioral:  Negative for depression. The patient does not have insomnia.    Current medications, allergy, social history, past surgical history, past medical history, family history were all reviewed.    Vitals:  BP (!) 139/59 (BP Location: Right Arm, Patient Position: Sitting)   Pulse (!) 105   Temp 98.2 F (36.8 C) (Oral)   Resp 17   Wt 109 lb 9 oz (49.7 kg)   LMP 03/22/2003 (Exact Date)   SpO2 100%   BMI 20.04 kg/m   Physical Exam:  Physical Exam Exam conducted with a chaperone present.  Constitutional:      General: She is not in acute distress. Cardiovascular:     Rate and Rhythm: Normal rate and regular rhythm.  Pulmonary:     Effort: Pulmonary effort is normal.     Breath sounds: Normal breath sounds. No wheezing or rhonchi.  Abdominal:     Palpations: Abdomen is soft.     Tenderness: There is no abdominal tenderness. There is no right CVA tenderness or left CVA tenderness.     Hernia: No hernia is present.  Genitourinary:    General: Normal vulva.     Urethra: No urethral lesion.     Vagina: No  lesions. Short, synechiae at apex    Cervix: No cervical bleeding.  Musculoskeletal:     Cervical back: Neck supple.     Right lower leg: No edema.     Left lower leg: No edema.  Lymphadenopathy:     Upper Body:     Right upper body: No supraclavicular adenopathy.     Left upper body: No supraclavicular adenopathy.     Lower Body: No right inguinal adenopathy. No left inguinal adenopathy.  Skin:    Findings: No rash.  Neurological:     Mental Status: She is oriented to person, place, and time.   Assessment/Plan:  VAIN III (vaginal intraepithelial neoplasia grade III) Ms. Renee Watkins  is a 75 y.o.  year old with history of CIN III, s/p hysterectomy and upper vaginectomy (with BSO) on 08/15/17. Now with VAIN 3.    >Review the Pap findings. If it remains high grade dysplasia, consider her for either trial of 5-FU or laser of the entire vaginal mucosa. If the pap at that time shows regression, she will follow-up with Dr Talbert Nan again for annual paps    Renee Crocker, MD

## 2021-01-20 NOTE — Patient Instructions (Signed)
Return as needed

## 2021-01-20 NOTE — Assessment & Plan Note (Signed)
Ms. Renee Watkins  is a 75 y.o.  year old with history of CIN III, s/p hysterectomy and upper vaginectomy (with BSO) on 08/15/17. Now with VAIN 3.   >Review the Pap findings. If it remains high grade dysplasia, consider her for either trial of 5-FU or laser of the entire vaginal mucosa. If the pap at that time shows regression, she will follow-up with Dr Talbert Nan again for annual paps

## 2021-01-21 ENCOUNTER — Encounter: Payer: Self-pay | Admitting: Obstetrics & Gynecology

## 2021-01-26 LAB — CYTOLOGY - PAP: Diagnosis: HIGH — AB

## 2021-01-28 ENCOUNTER — Telehealth: Payer: Self-pay

## 2021-01-28 NOTE — Telephone Encounter (Signed)
Spoke with patients daughter Mechele Claude regarding recent pap smear results. Per Joylene John, NP Pap smear is showing high grade pre-cancerous changes like patient has had before. Patient will need to have a follow up appointment with Dr. Berline Lopes. Joanne verbalizes understanding. Appointment scheduled for 02/10/21 at 1:30 pm. She is in agreement of date and time of appointment. Instructed to call with any questions or concerns.

## 2021-02-09 ENCOUNTER — Encounter: Payer: Self-pay | Admitting: Gynecologic Oncology

## 2021-02-10 ENCOUNTER — Inpatient Hospital Stay (HOSPITAL_BASED_OUTPATIENT_CLINIC_OR_DEPARTMENT_OTHER): Payer: Medicare HMO | Admitting: Gynecologic Oncology

## 2021-02-10 ENCOUNTER — Encounter: Payer: Self-pay | Admitting: Gynecologic Oncology

## 2021-02-10 ENCOUNTER — Other Ambulatory Visit: Payer: Self-pay

## 2021-02-10 VITALS — BP 163/53 | HR 108 | Temp 99.5°F | Resp 16 | Ht 62.0 in | Wt 106.0 lb

## 2021-02-10 DIAGNOSIS — R87613 High grade squamous intraepithelial lesion on cytologic smear of cervix (HGSIL): Secondary | ICD-10-CM | POA: Diagnosis not present

## 2021-02-10 DIAGNOSIS — Z79899 Other long term (current) drug therapy: Secondary | ICD-10-CM | POA: Diagnosis not present

## 2021-02-10 DIAGNOSIS — I10 Essential (primary) hypertension: Secondary | ICD-10-CM | POA: Diagnosis not present

## 2021-02-10 DIAGNOSIS — Z90710 Acquired absence of both cervix and uterus: Secondary | ICD-10-CM | POA: Diagnosis not present

## 2021-02-10 DIAGNOSIS — Z79890 Hormone replacement therapy (postmenopausal): Secondary | ICD-10-CM | POA: Diagnosis not present

## 2021-02-10 DIAGNOSIS — Z90722 Acquired absence of ovaries, bilateral: Secondary | ICD-10-CM | POA: Diagnosis not present

## 2021-02-10 DIAGNOSIS — Z86001 Personal history of in-situ neoplasm of cervix uteri: Secondary | ICD-10-CM | POA: Diagnosis not present

## 2021-02-10 DIAGNOSIS — M810 Age-related osteoporosis without current pathological fracture: Secondary | ICD-10-CM | POA: Diagnosis not present

## 2021-02-10 DIAGNOSIS — D072 Carcinoma in situ of vagina: Secondary | ICD-10-CM

## 2021-02-10 DIAGNOSIS — Z9079 Acquired absence of other genital organ(s): Secondary | ICD-10-CM | POA: Diagnosis not present

## 2021-02-10 NOTE — Progress Notes (Signed)
Gynecologic Oncology Return Clinic Visit  02/10/2021  Reason for Visit: Vaginoscopy  Treatment History: Renee Watkins is a 75 year old woman who is seen in consultation at the request of Dr Talbert Nan for vaginal dysplasia and agglutination.   The patient has a long standing history of abnormal paps and high risk HPV.   She had a pap in August 2016 which showed ASCUS with high risk HPV positive. Cytology was negaive in 2017 but high risk HPV remained present.  On 11/08/16 she underwent a pap which showed ASC-H with positive high risk HPV. Colposcopy of the vagina on 11/23/16 showed an agglutinated upper vagina and the cervix could not be visualized. However, there was an area of increased pigmentation of the vagina just under the fusion at 6 o'clock and a biopsy was taken. There was decreased lugols staining throughout the vagina and a biopsy was taken at 12 o'clock.   These biopsies revealed "CIN 2-3".   A TVUS was obtained on 12/20/16 to identify the cervix as none could be seen on exam. The US showed a 6.8x3.6x3.2cm uterus with a thin endometrium. The cervix was grossly normal on [place].    She was treated with 5-FU vaginally for 2 months in December and January, 2019. She then underwent repeat pap on 06/15/17 which showed VAIN/CIN 2-3 with high risk HPV detected.    She underwent robotic assisted modified radical (Type II) hysterectomy and upper vaginectomy on 08/15/17. Final pathology showed a focal high grade squamous intrapeithelial lesions (CIN III) in the cervix, but negative vaginal margins. The ovaries and tubes were benign.   Renee Watkins had follow-up with Dr Talbert Nan, initially having normal paps after her procedure.   Vaginal pap on 11/14/19 showed HGSIL.  Vaginal biopsy on 11/27/19 showed atrophic squamous mucosa.  No interventions took place after these results.    Seen in 07/2020 -normal colposcopic exam.  No lesion amenable to resection or laser.  Plan was to treat with 6 months of vaginal  estrogen and repeat Pap in November.  If high-grade dysplasia persisted, consideration for either 5-FU or laser of the entire vaginal mucosa was discussed.  Interval History: Patient presents today for vaginoscopy.  She last was seen on 11/2 and Pap smear was performed.  This returned showing HCl.  She denies any vaginal bleeding or discharge.  She notes stopping her vaginal estrogen at least 3 or 4 months ago as she felt like it would come out as soon as she put it in.  She is using coconut oil for vaginal dryness.  She denies any vaginal bleeding or discharge.  Past Medical/Surgical History: Past Medical History:  Diagnosis Date   Abnormal Pap smear of cervix 09/30/2013   neg pap HPV HR +, 8-10-16ASCUS HPV HR+, 8/17 neg HPV HR+   Bilateral artificial lens implant    Hypertension    Osteoporosis    STD (sexually transmitted disease)    HSV    Past Surgical History:  Procedure Laterality Date   Base Wedge Osteomy Right 03/10/10   right foot   BUNIONECTOMY Right 03/08/12   Keller right foot   Capsulotomy IPJ Right 03/08/12   Right foot 2   CATARACT EXTRACTION     both eyes   COLONOSCOPY WITH PROPOFOL N/A 07/08/2014   Procedure: COLONOSCOPY WITH PROPOFOL;  Surgeon: Garlan Fair, MD;  Location: [facility];  Service: Endoscopy;  Laterality: N/A;   COLPOSCOPY     2015, ECC neg, polyp neg, 2016 & 2017   Cotton  Ost w/graft Right 03/10/10   right foot   FOOT SURGERY Right 2000, 6767,2094   FOOT SURGERY Left 2002, 2006, 2010   Hammer toe repair Right 03/10/10   right foot # 5   Hammer toe repair Left 03/02/11   left foot 3 . 5   MANDIBLE FRACTURE SURGERY     ROBOTIC ASSISTED TOTAL HYSTERECTOMY N/A 08/15/2017   Procedure: XI ROBOTIC ASSISTED MODIFIED RADICAL TOTAL HYSTERECTOMY WITH UPPER VAGINECTOMY;  Surgeon: Everitt Amber, MD;  Location: [facility];  Service: Gynecology;  Laterality: N/A;   TUBAL LIGATION      Family History  Problem Relation Age of Onset   Hypertension Sister     Leukemia Sister    Hypertension Brother    Hypertension Mother     Social History   Socioeconomic History   Marital status: Divorced    Spouse name: Not on file   Number of children: Not on file   Years of education: Not on file   Highest education level: Not on file  Occupational History   Not on file  Tobacco Use   Smoking status: Never   Smokeless tobacco: Never  Vaping Use   Vaping Use: Never used  Substance and Sexual Activity   Alcohol use: No   Drug use: No   Sexual activity: Not Currently    Birth control/protection: Surgical    Comment: BTL, hysterectomy  Other Topics Concern   Not on file  Social History Narrative   Not on file   Social Determinants of Health   Financial Resource Strain: Not on file  Food Insecurity: Not on file  Transportation Needs: Not on file  Physical Activity: Not on file  Stress: Not on file  Social Connections: Not on file    Current Medications:  Current Outpatient Medications:    CALCIUM PO, Take 1 tablet by mouth 2 (two) times daily. 500 mg per, Disp: , Rfl:    Cholecalciferol (VITAMIN D) 2000 units tablet, Take 2,000 Int'l Units by mouth 3 (three) times a week., Disp: , Rfl:    denosumab (PROLIA) 60 MG/ML SOSY injection, Inject 60 mg into the skin every 6 (six) months., Disp: , Rfl:    furosemide (LASIX) 40 MG tablet, Take 40 mg by mouth daily., Disp: , Rfl:    KLOR-CON M10 10 MEQ tablet, Take 10 mEq by mouth daily., Disp: , Rfl:    lisinopril (PRINIVIL,ZESTRIL) 10 MG tablet, Take 10 mg by mouth every morning. , Disp: , Rfl:    Multiple Vitamin (MULTIVITAMIN) tablet, Take 1 tablet by mouth daily., Disp: , Rfl:    Multiple Vitamins-Minerals (OCUVITE PO), Take by mouth daily., Disp: , Rfl:    valACYclovir (VALTREX) 500 MG tablet, TAKE 1 TABLET BY MOUTH TWICE A DAY FOR 3 DAYS AS NEEDED, Disp: 180 tablet, Rfl: 1   Vitamin D, Ergocalciferol, (DRISDOL) 50000 UNITS CAPS, Take 50,000 Units by mouth every 30 (thirty) days., Disp: ,  Rfl:    estradiol (ESTRACE VAGINAL) 0.1 MG/GM vaginal cream, Place 1 Applicatorful vaginally at bedtime. (Patient not taking: Reported on 01/19/2021), Disp: 42.5 g, Rfl: 12  Review of Systems: Denies appetite changes, fevers, chills, fatigue, unexplained weight changes. Denies hearing loss, neck lumps or masses, mouth sores, ringing in ears or voice changes. Denies cough or wheezing.  Denies shortness of breath. Denies chest pain or palpitations. Denies leg swelling. Denies abdominal distention, pain, blood in stools, constipation, diarrhea, nausea, vomiting, or early satiety. Denies pain with intercourse, dysuria, frequency, hematuria or  incontinence. Denies hot flashes, pelvic pain, vaginal bleeding or vaginal discharge.   Denies joint pain, back pain or muscle pain/cramps. Denies itching, rash, or wounds. Denies dizziness, headaches, numbness or seizures. Denies swollen lymph nodes or glands, denies easy bruising or bleeding. Denies anxiety, depression, confusion, or decreased concentration.  Physical Exam: BP (!) 163/53 (BP Location: Right Arm, Patient Position: Sitting)   Pulse (!) 108   Temp 99.5 F (37.5 C) (Tympanic)   Resp 16   Ht 5\' 2"  (1.575 m)   Wt 106 lb (48.1 kg)   LMP 03/22/2003 (Exact Date)   SpO2 100%   BMI 19.39 kg/m  General: Alert, oriented, no acute distress. HEENT: Normocephalic, atraumatic, sclera anicteric. Chest: Unlabored breathing on room air. Extremities: Grossly normal range of motion.  Warm, well perfused.  No edema bilaterally. Skin: No rashes or lesions noted. GU: Normal appearing external genitalia without erythema, excoriation, or lesions.  Speculum exam reveals moderate atrophy of the vagina, no discharge or bleeding.  Bimanual exam reveals no nodularity, cuff intact.  Vagina is foreshortened measuring approximately 4 cm.    Vaginoscopy and biopsy procedure Preoperative diagnosis: HSIL Pap Postoperative diagnosis: Same as above Physician:  Berline Lopes MD Specimen: Vaginal cuff biopsy Estimated blood loss: Minimal Procedure: After the procedure was discussed with the patient and verbal consent obtained, she was placed in dorsolithotomy position.  Speculum was placed in the vagina and the vagina was well visualized including the cuff.  Lugol's was applied to the entire vagina with no significant areas of decreased uptake.  Betadine was used to cleanse an area along the cuff x3.  Random biopsy taken from the midportion of the cuff.  Silver nitrate was used to achieve hemostasis.  Biopsy was placed in formalin.  Overall patient tolerated the procedure well and all instruments removed from the vagina.  Laboratory & Radiologic Studies: 01/20/21: HSIL pap  Assessment & Plan: Renee Watkins is a 75 y.o. woman with long history outlined above of vaginal dysplasia with recent Pap smear showing high-grade lesion.  No findings on exam today consistent with a high-grade lesion.  Random biopsy taken.  Reviewed my exam findings with the patient.  If biopsy does not show dysplasia, then we will continue to have her use vaginal estrogen and follow-up in 6 months.  I am concerned between the discrepancy of her Pap smears and exam/biopsies.  If biopsy were to confirm high-grade dysplasia, then we will discuss further treatment options of 5-FU versus vaginal laser with the patient.  28 minutes of total time was spent for this patient encounter, including preparation, face-to-face counseling with the patient and coordination of care, and documentation of the encounter.  Jeral Pinch, MD  Division of Gynecologic Oncology  Department of Obstetrics and Gynecology  [facility]

## 2021-02-10 NOTE — Patient Instructions (Signed)
Dr. Berline Lopes will call you with the results of your biopsy from today. Please call for any needs or concerns.

## 2021-02-12 LAB — SURGICAL PATHOLOGY

## 2021-02-15 ENCOUNTER — Telehealth: Payer: Self-pay

## 2021-02-15 NOTE — Telephone Encounter (Signed)
Spoke with patients daughter Mechele Claude.  Reviewed recent biopsy results. Per Joylene John, NP biopsy showed high grade/pre-cancer. We need to schedule a follow up appointment to discuss results and options. Mechele Claude verbalized understanding. Appointment scheduled for 02/17/21 at 2:45pm. Mechele Claude is in agreement of date and time of appointment.  Mechele Claude requested a new code for [place] access. Code successfully sent via text to [phone]. Instructed to call with any needs.

## 2021-02-16 ENCOUNTER — Telehealth: Payer: Self-pay | Admitting: *Deleted

## 2021-02-16 NOTE — Telephone Encounter (Signed)
Patient's daughter called and rescheduled appt from 11/30 to 12/14

## 2021-02-16 NOTE — Telephone Encounter (Signed)
Returned the patient's call and moved appt from 12/14 to 12/12

## 2021-02-17 ENCOUNTER — Inpatient Hospital Stay: Payer: Medicare HMO | Admitting: Gynecologic Oncology

## 2021-03-01 ENCOUNTER — Encounter: Payer: Self-pay | Admitting: Gynecologic Oncology

## 2021-03-01 ENCOUNTER — Inpatient Hospital Stay: Payer: Medicare HMO | Attending: Gynecologic Oncology | Admitting: Gynecologic Oncology

## 2021-03-01 ENCOUNTER — Other Ambulatory Visit: Payer: Self-pay

## 2021-03-01 VITALS — BP 149/73 | HR 95 | Temp 98.2°F | Resp 16 | Ht 62.0 in | Wt 107.2 lb

## 2021-03-01 DIAGNOSIS — D072 Carcinoma in situ of vagina: Secondary | ICD-10-CM | POA: Diagnosis not present

## 2021-03-01 DIAGNOSIS — R87613 High grade squamous intraepithelial lesion on cytologic smear of cervix (HGSIL): Secondary | ICD-10-CM

## 2021-03-01 DIAGNOSIS — Z79899 Other long term (current) drug therapy: Secondary | ICD-10-CM | POA: Diagnosis not present

## 2021-03-01 DIAGNOSIS — I10 Essential (primary) hypertension: Secondary | ICD-10-CM | POA: Diagnosis not present

## 2021-03-01 DIAGNOSIS — N952 Postmenopausal atrophic vaginitis: Secondary | ICD-10-CM

## 2021-03-01 DIAGNOSIS — Z79890 Hormone replacement therapy (postmenopausal): Secondary | ICD-10-CM | POA: Insufficient documentation

## 2021-03-01 NOTE — Progress Notes (Signed)
Gynecologic Oncology Return Clinic Visit  03/01/21  Reason for Visit: follow-up recent biopsy, treatment planning  Treatment History: Renee Watkins is a 75 year old woman who is seen in consultation at the request of Dr Talbert Nan for vaginal dysplasia and agglutination.   The patient has a long standing history of abnormal paps and high risk HPV.   She had a pap in August 2016 which showed ASCUS with high risk HPV positive. Cytology was negaive in 2017 but high risk HPV remained present.  On 11/08/16 she underwent a pap which showed ASC-H with positive high risk HPV. Colposcopy of the vagina on 11/23/16 showed an agglutinated upper vagina and the cervix could not be visualized. However, there was an area of increased pigmentation of the vagina just under the fusion at 6 o'clock and a biopsy was taken. There was decreased lugols staining throughout the vagina and a biopsy was taken at 12 o'clock.   These biopsies revealed "CIN 2-3".   A TVUS was obtained on 12/20/16 to identify the cervix as none could be seen on exam. The US showed a 6.8x3.6x3.2cm uterus with a thin endometrium. The cervix was grossly normal on [place].    She was treated with 5-FU vaginally for 2 months in December and January, 2019. She then underwent repeat pap on 06/15/17 which showed VAIN/CIN 2-3 with high risk HPV detected.    She underwent robotic assisted modified radical (Type II) hysterectomy and upper vaginectomy on 08/15/17. Final pathology showed a focal high grade squamous intrapeithelial lesions (CIN III) in the cervix, but negative vaginal margins. The ovaries and tubes were benign.   Tien had follow-up with Dr Talbert Nan, initially having normal paps after her procedure.   Vaginal pap on 11/14/19 showed HGSIL.  Vaginal biopsy on 11/27/19 showed atrophic squamous mucosa.  No interventions took place after these results.    Seen in 07/2020 -normal colposcopic exam.  No lesion amenable to resection or laser.  Plan was to  treat with 6 months of vaginal estrogen and repeat Pap in November.  If high-grade dysplasia persisted, consideration for either 5-FU or laser of the entire vaginal mucosa was discussed.  01/20/21: Vaginal pap showed HSIL  02/10/21: Vaginoscopy performed with no significant decreased uptake after application of Lugol's.  Random biopsy taken from the midportion of the cuff.  Biopsy showed the VAIN 2-3.  Interval History: Patient presents today with one of her daughters. Denies any symptoms since biopsy.   Past Medical/Surgical History: Past Medical History:  Diagnosis Date   Abnormal Pap smear of cervix 09/30/2013   neg pap HPV HR +, 8-10-16ASCUS HPV HR+, 8/17 neg HPV HR+   Bilateral artificial lens implant    Hypertension    Osteoporosis    STD (sexually transmitted disease)    HSV    Past Surgical History:  Procedure Laterality Date   Base Wedge Osteomy Right 03/10/10   right foot   BUNIONECTOMY Right 03/08/12   Keller right foot   Capsulotomy IPJ Right 03/08/12   Right foot 2   CATARACT EXTRACTION     both eyes   COLONOSCOPY WITH PROPOFOL N/A 07/08/2014   Procedure: COLONOSCOPY WITH PROPOFOL;  Surgeon: Garlan Fair, MD;  Location: [facility];  Service: Endoscopy;  Laterality: N/A;   COLPOSCOPY     2015, ECC neg, polyp neg, 2016 & 2017   Cotton Ost w/graft Right 03/10/10   right foot   FOOT SURGERY Right 2000, 2774,1287   FOOT SURGERY Left 2002, 2006, 2010  Hammer toe repair Right 03/10/10   right foot # 5   Hammer toe repair Left 03/02/11   left foot 3 . 5   MANDIBLE FRACTURE SURGERY     ROBOTIC ASSISTED TOTAL HYSTERECTOMY N/A 08/15/2017   Procedure: XI ROBOTIC ASSISTED MODIFIED RADICAL TOTAL HYSTERECTOMY WITH UPPER VAGINECTOMY;  Surgeon: Everitt Amber, MD;  Location: [facility];  Service: Gynecology;  Laterality: N/A;   TUBAL LIGATION      Family History  Problem Relation Age of Onset   Hypertension Sister    Leukemia Sister    Hypertension Brother     Hypertension Mother     Social History   Socioeconomic History   Marital status: Divorced    Spouse name: Not on file   Number of children: Not on file   Years of education: Not on file   Highest education level: Not on file  Occupational History   Not on file  Tobacco Use   Smoking status: Never   Smokeless tobacco: Never  Vaping Use   Vaping Use: Never used  Substance and Sexual Activity   Alcohol use: No   Drug use: No   Sexual activity: Not Currently    Birth control/protection: Surgical    Comment: BTL, hysterectomy  Other Topics Concern   Not on file  Social History Narrative   Not on file   Social Determinants of Health   Financial Resource Strain: Not on file  Food Insecurity: Not on file  Transportation Needs: Not on file  Physical Activity: Not on file  Stress: Not on file  Social Connections: Not on file    Current Medications:  Current Outpatient Medications:    CALCIUM PO, Take 1 tablet by mouth 2 (two) times daily. 500 mg per, Disp: , Rfl:    Cholecalciferol (VITAMIN D) 2000 units tablet, Take 2,000 Int'l Units by mouth 3 (three) times a week., Disp: , Rfl:    denosumab (PROLIA) 60 MG/ML SOSY injection, Inject 60 mg into the skin every 6 (six) months., Disp: , Rfl:    estradiol (ESTRACE VAGINAL) 0.1 MG/GM vaginal cream, Place 1 Applicatorful vaginally at bedtime. (Patient not taking: Reported on 01/19/2021), Disp: 42.5 g, Rfl: 12   furosemide (LASIX) 40 MG tablet, Take 40 mg by mouth daily., Disp: , Rfl:    KLOR-CON M10 10 MEQ tablet, Take 10 mEq by mouth daily., Disp: , Rfl:    lisinopril (PRINIVIL,ZESTRIL) 10 MG tablet, Take 10 mg by mouth every morning. , Disp: , Rfl:    Multiple Vitamin (MULTIVITAMIN) tablet, Take 1 tablet by mouth daily., Disp: , Rfl:    Multiple Vitamins-Minerals (OCUVITE PO), Take by mouth daily., Disp: , Rfl:    valACYclovir (VALTREX) 500 MG tablet, TAKE 1 TABLET BY MOUTH TWICE A DAY FOR 3 DAYS AS NEEDED, Disp: 180 tablet, Rfl:  1   Vitamin D, Ergocalciferol, (DRISDOL) 50000 UNITS CAPS, Take 50,000 Units by mouth every 30 (thirty) days., Disp: , Rfl:   Review of Systems: Denies appetite changes, fevers, chills, fatigue, unexplained weight changes. Denies hearing loss, neck lumps or masses, mouth sores, ringing in ears or voice changes. Denies cough or wheezing.  Denies shortness of breath. Denies chest pain or palpitations. Denies leg swelling. Denies abdominal distention, pain, blood in stools, constipation, diarrhea, nausea, vomiting, or early satiety. Denies pain with intercourse, dysuria, frequency, hematuria or incontinence. Denies hot flashes, pelvic pain, vaginal bleeding or vaginal discharge.   Denies joint pain, back pain or muscle pain/cramps. Denies itching, rash, or  wounds. Denies dizziness, headaches, numbness or seizures. Denies swollen lymph nodes or glands, denies easy bruising or bleeding. Denies anxiety, depression, confusion, or decreased concentration.  Physical Exam: BP (!) 149/73 (BP Location: Right Arm, Patient Position: Sitting)   Pulse 95   Temp 98.2 F (36.8 C) (Oral)   Resp 16   Ht 5\' 2"  (1.575 m)   Wt 107 lb 3.2 oz (48.6 kg)   LMP 03/22/2003 (Exact Date)   SpO2 100%   BMI 19.61 kg/m  General: Alert, oriented, no acute distress. HEENT: Normocephalic, atraumatic, sclera anicteric.  Laboratory & Radiologic Studies: FINAL MICROSCOPIC DIAGNOSIS:   A. VAGINA, CUFF, BIOPSY:  - High-grade squamous intraepithelial lesion (VaIN2-3, high grade  dysplasia)   Assessment & Plan: Renee Watkins is a 75 y.o. woman with long history outlined above of vaginal dysplasia with recent Pap smear showing high-grade lesion, biopsy confirmed VAIN 2-3 during recent visit.  Spent some time discussing with the patient and her daughter dysplasia history, HPV infection (and its difference from HSV), and prior treatments for cervical and then vaginal dysplasia. Given high-grade dysplasia on biopsy, I  recommend proceeding with treatment. I reviewed treatment options including topical or surgical (laser). Given the patient's difficulty with using vaginal estrogen, I suspect that even when she was prescribed 5FU previously for high-grade dysplasia, she may not have been using it or using it correctly. Given the difficulty with 5FU treatment, I strongly recommend that we proceed with laser ablation. I described discrepancy of her exam and biopsy results. I think she continues to have significant vaginal atrophy and would like to use vaginal estrogen in combination with laser as our treatment strategy.   I answered all of the patient's and her daughter's questions to the best of my ability today. They'd like to spend some time discussing our visit today and will either mychart message me or call the clinic if/when ready to schedule outpatient surgery.   45 minutes of total time was spent for this patient encounter, including preparation, face-to-face counseling with the patient and coordination of care, and documentation of the encounter.  Jeral Pinch, MD  Division of Gynecologic Oncology  Department of Obstetrics and Gynecology  [facility]

## 2021-03-01 NOTE — Patient Instructions (Addendum)
Today we discussed treatment options for your high grade vaginal pre-cancer. I am recommend laser surgery followed by vaginal estrogen treatment. Please call the clinic when you are ready to make a decision about booking surgery or if you have any further questions.

## 2021-03-03 ENCOUNTER — Ambulatory Visit: Payer: Medicare HMO | Admitting: Gynecologic Oncology

## 2021-03-09 ENCOUNTER — Telehealth: Payer: Self-pay | Admitting: Oncology

## 2021-03-09 NOTE — Telephone Encounter (Signed)
Called Mechele Claude (daughter) regarding decision for surgery and to see if they had any questions.  They said they have decided on surgery and would like to schedule for the beginning of February.  She also said Aiyanna is concerned about her out of pocket cost after insurance.  She is going to call her insurance.  Also provided her with the number to Patient Billing and the diagnosis code. Advised her that we will call her back with potential surgery dates.

## 2021-03-10 ENCOUNTER — Encounter: Payer: Self-pay | Admitting: Gynecologic Oncology

## 2021-03-10 NOTE — Telephone Encounter (Signed)
Spoke with patients daughter Mechele Claude this afternoon. Patients surgery has been scheduled for 04/27/21 at the [facility]. Mechele Claude is in agreement of  surgery date. Advised that instructions for before and after surgery will be sent via mychart. Mechele Claude verbalized understanding and states she has access to patients mychart.  Instructed to call with any questions or concerns.

## 2021-04-22 ENCOUNTER — Encounter (HOSPITAL_BASED_OUTPATIENT_CLINIC_OR_DEPARTMENT_OTHER): Payer: Self-pay | Admitting: Gynecologic Oncology

## 2021-04-22 ENCOUNTER — Telehealth: Payer: Self-pay

## 2021-04-22 ENCOUNTER — Other Ambulatory Visit: Payer: Self-pay

## 2021-04-22 NOTE — Telephone Encounter (Signed)
Spoke with patient's daughter, Trachelle Low regarding her FMLA paper work. Pt informed that the paper work is ready and will be faxed to [place] and [facility] at [phone].  Marlowe Kays also requesting a copy for herself and this was sent to her personal email provided.  Per Connies request, post op appt with Dr. Berline Lopes for 06/07/2021 was scheduled for 3:30 pm instead of 2:00 pm.

## 2021-04-22 NOTE — Progress Notes (Signed)
Spoke w/ via phone for pre-op interview---daughter, Renee Watkins needs dos---- EKG and I-Stat              Watkins results------Echo 04/01/20 COVID test -----patient states asymptomatic no test needed. Arrive at -------1245pm 04/27/21. NPO after MN NO Solid Food.  Clear liquids from MN until---1145 am Med rec completed. Medications to take morning of surgery -----none; hold vitamins and supplements except K+ 5 days prior to sx. Diabetic medication -----NA Patient instructed no nail polish to be worn day of surgery Patient instructed to bring photo id and insurance card day of surgery Patient aware to have Driver (ride ) / caregiver    for 24 hours after surgery. Patient Special Instructions -----NA Pre-Op special Istructions -----NA Patient verbalized understanding of instructions that were given at this phone interview. Patient denies shortness of breath, chest pain, fever, cough at this phone interview.

## 2021-04-26 ENCOUNTER — Telehealth: Payer: Self-pay

## 2021-04-26 NOTE — Telephone Encounter (Signed)
Spoke with patients daughter Mechele Claude. Telephone call to check on pre-operative status.  Patient compliant with pre-operative instructions.  Reinforced nothing to eat after midnight. Clear liquids until 1100 am. Patient to arrive at 1200 pm.  No questions or concerns voiced.  Instructed to call for any needs.

## 2021-04-26 NOTE — Progress Notes (Signed)
Pt called, talked with daughter. Informed to be here at 1200 instead of 1245. Verified understanding

## 2021-04-27 ENCOUNTER — Encounter (HOSPITAL_BASED_OUTPATIENT_CLINIC_OR_DEPARTMENT_OTHER): Payer: Self-pay | Admitting: Gynecologic Oncology

## 2021-04-27 ENCOUNTER — Encounter (HOSPITAL_BASED_OUTPATIENT_CLINIC_OR_DEPARTMENT_OTHER)
Admission: RE | Disposition: A | Discharge status: Home / Self Care | Payer: Self-pay | Source: Home / Self Care | Attending: Gynecologic Oncology

## 2021-04-27 ENCOUNTER — Other Ambulatory Visit: Payer: Self-pay

## 2021-04-27 ENCOUNTER — Ambulatory Visit (HOSPITAL_BASED_OUTPATIENT_CLINIC_OR_DEPARTMENT_OTHER): Payer: Medicare HMO | Admitting: Anesthesiology

## 2021-04-27 ENCOUNTER — Ambulatory Visit (HOSPITAL_BASED_OUTPATIENT_CLINIC_OR_DEPARTMENT_OTHER)
Admission: RE | Admit: 2021-04-27 | Discharge: 2021-04-27 | Disposition: A | Discharge status: Home / Self Care | Payer: Medicare HMO | Attending: Gynecologic Oncology | Admitting: Gynecologic Oncology

## 2021-04-27 DIAGNOSIS — N760 Acute vaginitis: Secondary | ICD-10-CM | POA: Diagnosis not present

## 2021-04-27 DIAGNOSIS — I10 Essential (primary) hypertension: Secondary | ICD-10-CM | POA: Diagnosis not present

## 2021-04-27 DIAGNOSIS — N891 Moderate vaginal dysplasia: Secondary | ICD-10-CM | POA: Diagnosis present

## 2021-04-27 DIAGNOSIS — D072 Carcinoma in situ of vagina: Secondary | ICD-10-CM | POA: Diagnosis not present

## 2021-04-27 DIAGNOSIS — I119 Hypertensive heart disease without heart failure: Secondary | ICD-10-CM | POA: Insufficient documentation

## 2021-04-27 DIAGNOSIS — N893 Dysplasia of vagina, unspecified: Secondary | ICD-10-CM | POA: Diagnosis not present

## 2021-04-27 DIAGNOSIS — N952 Postmenopausal atrophic vaginitis: Secondary | ICD-10-CM | POA: Insufficient documentation

## 2021-04-27 DIAGNOSIS — Z87411 Personal history of vaginal dysplasia: Secondary | ICD-10-CM | POA: Diagnosis not present

## 2021-04-27 HISTORY — PX: CO2 LASER APPLICATION: SHX5778

## 2021-04-27 LAB — POCT I-STAT, CHEM 8
BUN: 26 mg/dL — ABNORMAL HIGH (ref 8–23)
Calcium, Ion: 1.22 mmol/L (ref 1.15–1.40)
Chloride: 103 mmol/L (ref 98–111)
Creatinine, Ser: 0.6 mg/dL (ref 0.44–1.00)
Glucose, Bld: 105 mg/dL — ABNORMAL HIGH (ref 70–99)
HCT: 40 % (ref 36.0–46.0)
Hemoglobin: 13.6 g/dL (ref 12.0–15.0)
Potassium: 3.9 mmol/L (ref 3.5–5.1)
Sodium: 140 mmol/L (ref 135–145)
TCO2: 28 mmol/L (ref 22–32)

## 2021-04-27 SURGERY — CO2 LASER APPLICATION
Anesthesia: General | Site: Vagina

## 2021-04-27 MED ORDER — ACETAMINOPHEN 325 MG PO TABS: 325.0000 mg | ORAL_TABLET | ORAL | Status: DC | PRN

## 2021-04-27 MED ORDER — ESTRADIOL 0.1 MG/GM VA CREA: 1.0000 | TOPICAL_CREAM | VAGINAL | 12 refills | Status: DC

## 2021-04-27 MED ORDER — FENTANYL CITRATE (PF) 100 MCG/2ML IJ SOLN
INTRAMUSCULAR | Status: DC | PRN
  Administered 2021-04-27 (×3): 50 ug via INTRAVENOUS

## 2021-04-27 MED ORDER — 0.9 % SODIUM CHLORIDE (POUR BTL) OPTIME
TOPICAL | Status: DC | PRN
  Administered 2021-04-27: 1000 mL

## 2021-04-27 MED ORDER — DEXAMETHASONE SODIUM PHOSPHATE 10 MG/ML IJ SOLN: 4.0000 mg | INTRAMUSCULAR | Status: DC

## 2021-04-27 MED ORDER — ACETAMINOPHEN 500 MG PO TABS: 1000.0000 mg | ORAL_TABLET | ORAL | Status: DC

## 2021-04-27 MED ORDER — OXYCODONE HCL 5 MG PO TABS: 5.0000 mg | ORAL_TABLET | Freq: Once | ORAL | Status: DC | PRN

## 2021-04-27 MED ORDER — KETOROLAC TROMETHAMINE 30 MG/ML IJ SOLN
INTRAMUSCULAR | Status: AC
  Filled 2021-04-27: qty 1

## 2021-04-27 MED ORDER — MEPERIDINE HCL 25 MG/ML IJ SOLN: 6.2500 mg | INTRAMUSCULAR | Status: DC | PRN

## 2021-04-27 MED ORDER — IODINE STRONG (LUGOLS) 5 % PO SOLN
ORAL | Status: DC | PRN
  Administered 2021-04-27: 0.1 mL

## 2021-04-27 MED ORDER — ONDANSETRON HCL 4 MG/2ML IJ SOLN
INTRAMUSCULAR | Status: DC | PRN
  Administered 2021-04-27: 4 mg via INTRAVENOUS

## 2021-04-27 MED ORDER — ACETAMINOPHEN 500 MG PO TABS
1000.0000 mg | ORAL_TABLET | ORAL | Status: AC
  Administered 2021-04-27: 1000 mg via ORAL

## 2021-04-27 MED ORDER — ONDANSETRON HCL 4 MG/2ML IJ SOLN: 4.0000 mg | Freq: Once | INTRAMUSCULAR | Status: DC | PRN

## 2021-04-27 MED ORDER — OXYCODONE HCL 5 MG/5ML PO SOLN: 5.0000 mg | Freq: Once | ORAL | Status: DC | PRN

## 2021-04-27 MED ORDER — DEXAMETHASONE SODIUM PHOSPHATE 10 MG/ML IJ SOLN
INTRAMUSCULAR | Status: AC
  Filled 2021-04-27: qty 1

## 2021-04-27 MED ORDER — LIDOCAINE HCL (PF) 2 % IJ SOLN
INTRAMUSCULAR | Status: AC
  Filled 2021-04-27: qty 5

## 2021-04-27 MED ORDER — ONDANSETRON HCL 4 MG/2ML IJ SOLN
INTRAMUSCULAR | Status: AC
  Filled 2021-04-27: qty 2

## 2021-04-27 MED ORDER — FENTANYL CITRATE (PF) 100 MCG/2ML IJ SOLN: 25.0000 ug | INTRAMUSCULAR | Status: DC | PRN

## 2021-04-27 MED ORDER — TRANEXAMIC ACID-NACL 1000-0.7 MG/100ML-% IV SOLN
INTRAVENOUS | Status: AC
  Filled 2021-04-27: qty 100

## 2021-04-27 MED ORDER — SILVER SULFADIAZINE 1 % EX CREA
TOPICAL_CREAM | CUTANEOUS | Status: DC | PRN
  Administered 2021-04-27: 1 via TOPICAL

## 2021-04-27 MED ORDER — PROPOFOL 10 MG/ML IV BOLUS
INTRAVENOUS | Status: DC | PRN
  Administered 2021-04-27: 150 mg via INTRAVENOUS

## 2021-04-27 MED ORDER — PROPOFOL 10 MG/ML IV BOLUS
INTRAVENOUS | Status: AC
  Filled 2021-04-27: qty 20

## 2021-04-27 MED ORDER — DEXAMETHASONE SODIUM PHOSPHATE 4 MG/ML IJ SOLN
INTRAMUSCULAR | Status: DC | PRN
Start: 2021-04-27 — End: 2021-04-27
  Administered 2021-04-27: 10 mg via INTRAVENOUS

## 2021-04-27 MED ORDER — LACTATED RINGERS IV SOLN: INTRAVENOUS | Status: DC

## 2021-04-27 MED ORDER — LIDOCAINE HCL (CARDIAC) PF 100 MG/5ML IV SOSY
PREFILLED_SYRINGE | INTRAVENOUS | Status: DC | PRN
  Administered 2021-04-27: 100 mg via INTRAVENOUS

## 2021-04-27 MED ORDER — LIDOCAINE HCL 1 % IJ SOLN
INTRAMUSCULAR | Status: DC | PRN
  Administered 2021-04-27: 20 mL

## 2021-04-27 MED ORDER — MIDAZOLAM HCL 2 MG/2ML IJ SOLN
INTRAMUSCULAR | Status: AC
  Filled 2021-04-27: qty 2

## 2021-04-27 MED ORDER — ACETAMINOPHEN 160 MG/5ML PO SOLN: 325.0000 mg | ORAL | Status: DC | PRN

## 2021-04-27 MED ORDER — FENTANYL CITRATE (PF) 100 MCG/2ML IJ SOLN
INTRAMUSCULAR | Status: AC
  Filled 2021-04-27: qty 2

## 2021-04-27 MED ORDER — ACETAMINOPHEN 500 MG PO TABS
ORAL_TABLET | ORAL | Status: AC
  Filled 2021-04-27: qty 2

## 2021-04-27 SURGICAL SUPPLY — 18 items
DEPRESSOR TONGUE BLADE STERILE (MISCELLANEOUS) ×3 IMPLANT
DRSG TELFA 3X8 NADH (GAUZE/BANDAGES/DRESSINGS) ×2 IMPLANT
GAUZE 4X4 16PLY ~~LOC~~+RFID DBL (SPONGE) ×2 IMPLANT
GLOVE SURG ENC MOIS LTX SZ6 (GLOVE) ×3 IMPLANT
GLOVE SURG NEOPR MICRO LF SZ6 (GLOVE) ×2 IMPLANT
GLOVE SURG UNDER POLY LF SZ6 (GLOVE) ×2 IMPLANT
GLOVE SURG UNDER POLY LF SZ7 (GLOVE) ×1 IMPLANT
GOWN STRL REUS W/TWL LRG LVL3 (GOWN DISPOSABLE) ×5 IMPLANT
KIT TURNOVER CYSTO (KITS) ×2 IMPLANT
MANIFOLD NEPTUNE II (INSTRUMENTS) ×1 IMPLANT
NS IRRIG 1000ML POUR BTL (IV SOLUTION) ×1 IMPLANT
PACK VAGINAL WOMENS (CUSTOM PROCEDURE TRAY) ×2 IMPLANT
PAD DRESSING TELFA 3X8 NADH (GAUZE/BANDAGES/DRESSINGS) IMPLANT
PAD PREP 24X48 CUFFED NSTRL (MISCELLANEOUS) ×2 IMPLANT
SWAB OB GYN 8IN STERILE 2PK (MISCELLANEOUS) ×5 IMPLANT
TOWEL OR 17X26 10 PK STRL BLUE (TOWEL DISPOSABLE) ×2 IMPLANT
TUBE CONNECTING 12X1/4 (SUCTIONS) ×1 IMPLANT
VACUUM HOSE 7/8X10 W/ WAND (MISCELLANEOUS) ×1 IMPLANT

## 2021-04-27 NOTE — H&P (Signed)
Gynecologic Oncology H&P  04/27/21  Treatment History: Renee Watkins is a 76 year old woman who is seen in consultation at the request of Dr Talbert Nan for vaginal dysplasia and agglutination.   The patient has a long standing history of abnormal paps and high risk HPV.   She had a pap in August 2016 which showed ASCUS with high risk HPV positive. Cytology was negaive in 2017 but high risk HPV remained present.  On 11/08/16 she underwent a pap which showed ASC-H with positive high risk HPV. Colposcopy of the vagina on 11/23/16 showed an agglutinated upper vagina and the cervix could not be visualized. However, there was an area of increased pigmentation of the vagina just under the fusion at 6 o'clock and a biopsy was taken. There was decreased lugols staining throughout the vagina and a biopsy was taken at 12 o'clock.   These biopsies revealed "CIN 2-3".   A TVUS was obtained on 12/20/16 to identify the cervix as none could be seen on exam. The US showed a 6.8x3.6x3.2cm uterus with a thin endometrium. The cervix was grossly normal on [place].    She was treated with 5-FU vaginally for 2 months in December and January, 2019. She then underwent repeat pap on 06/15/17 which showed VAIN/CIN 2-3 with high risk HPV detected.    She underwent robotic assisted modified radical (Type II) hysterectomy and upper vaginectomy on 08/15/17. Final pathology showed a focal high grade squamous intrapeithelial lesions (CIN III) in the cervix, but negative vaginal margins. The ovaries and tubes were benign.   Renee Watkins had follow-up with Dr Talbert Nan, initially having normal paps after her procedure.   Vaginal pap on 11/14/19 showed HGSIL.  Vaginal biopsy on 11/27/19 showed atrophic squamous mucosa.  No interventions took place after these results.    Seen in 07/2020 -normal colposcopic exam.  No lesion amenable to resection or laser.  Plan was to treat with 6 months of vaginal estrogen and repeat Pap in November.  If high-grade  dysplasia persisted, consideration for either 5-FU or laser of the entire vaginal mucosa was discussed.   01/20/21: Vaginal pap showed HSIL   02/10/21: Vaginoscopy performed with no significant decreased uptake after application of Lugol's.  Random biopsy taken from the midportion of the cuff.  Biopsy showed the VAIN 2-3.   Interval History: Doing well today. Denies new issues.   Past Medical/Surgical History: Past Medical History:  Diagnosis Date   Abnormal Pap smear of cervix 09/30/2013   neg pap HPV HR +, 8-10-16ASCUS HPV HR+, 8/17 neg HPV HR+   Bilateral artificial lens implant    Hypertension    Osteoporosis    STD (sexually transmitted disease)    HSV   Vaginal intraepithelial neoplasia III (VAIN III)     Past Surgical History:  Procedure Laterality Date   Base Wedge Osteomy Right 03/10/10   right foot   BUNIONECTOMY Right 03/08/12   Keller right foot   Capsulotomy IPJ Right 03/08/12   Right foot 2   CATARACT EXTRACTION     both eyes   COLONOSCOPY WITH PROPOFOL N/A 07/08/2014   Procedure: COLONOSCOPY WITH PROPOFOL;  Surgeon: Garlan Fair, MD;  Location: [facility];  Service: Endoscopy;  Laterality: N/A;   COLPOSCOPY     2015, ECC neg, polyp neg, 2016 & 2017   [place] w/graft Right 03/10/10   right foot   FOOT SURGERY Right 2000, 1610,9604   FOOT SURGERY Left 2002, 2006, 2010   Hammer toe repair Right  03/10/10   right foot # 5   Hammer toe repair Left 03/02/11   left foot 3 . 5   MANDIBLE FRACTURE SURGERY     ROBOTIC ASSISTED TOTAL HYSTERECTOMY N/A 08/15/2017   Procedure: XI ROBOTIC ASSISTED MODIFIED RADICAL TOTAL HYSTERECTOMY WITH UPPER VAGINECTOMY;  Surgeon: Everitt Amber, MD;  Location: [facility];  Service: Gynecology;  Laterality: N/A;   TUBAL LIGATION      Family History  Problem Relation Age of Onset   Hypertension Sister    Leukemia Sister    Hypertension Brother    Hypertension Mother     Social History   Socioeconomic History   Marital  status: Divorced    Spouse name: Not on file   Number of children: Not on file   Years of education: Not on file   Highest education level: Not on file  Occupational History   Not on file  Tobacco Use   Smoking status: Never   Smokeless tobacco: Never  Vaping Use   Vaping Use: Never used  Substance and Sexual Activity   Alcohol use: No   Drug use: No   Sexual activity: Not Currently    Birth control/protection: Surgical    Comment: BTL, hysterectomy  Other Topics Concern   Not on file  Social History Narrative   Not on file   Social Determinants of Health   Financial Resource Strain: Not on file  Food Insecurity: Not on file  Transportation Needs: Not on file  Physical Activity: Not on file  Stress: Not on file  Social Connections: Not on file    Current Medications:  Current Facility-Administered Medications:    [START ON 04/28/2021] dexamethasone (DECADRON) injection 4 mg, 4 mg, Intravenous, On Call to OR, Cross, Melissa D, NP   lactated ringers infusion, , Intravenous, Continuous, Pervis Hocking, DO, Last Rate: 10 mL/hr at 04/27/21 1331, New Bag at 04/27/21 1331  Review of Systems: Denies appetite changes, fevers, chills, fatigue, unexplained weight changes. Denies hearing loss, neck lumps or masses, mouth sores, ringing in ears or voice changes. Denies cough or wheezing.  Denies shortness of breath. Denies chest pain or palpitations. Denies leg swelling. Denies abdominal distention, pain, blood in stools, constipation, diarrhea, nausea, vomiting, or early satiety. Denies pain with intercourse, dysuria, frequency, hematuria or incontinence. Denies hot flashes, pelvic pain, vaginal bleeding or vaginal discharge.   Denies joint pain, back pain or muscle pain/cramps. Denies itching, rash, or wounds. Denies dizziness, headaches, numbness or seizures. Denies swollen lymph nodes or glands, denies easy bruising or bleeding. Denies anxiety, depression, confusion, or  decreased concentration.  Physical Exam: BP (!) 159/72    Pulse 88    Temp 98.2 F (36.8 C) (Oral)    Resp 16    Ht 5\' 2"  (1.575 m)    Wt 107 lb (48.5 kg)    LMP 03/22/2003 (Exact Date)    SpO2 100%    BMI 19.57 kg/m  General: Alert, oriented, no acute distress. HEENT: Normocephalic, atraumatic, sclera anicteric. CV: regular rate and rhythm, no murmurs, rubs or gallops. Chest: Unlabored breathing on room air. Extremities: Grossly normal range of motion.  Warm, well perfused.  No edema bilaterally.  Laboratory & Radiologic Studies: Vaginal cuff biopsy 02/10/22: VAIN 2-3.  Assessment & Plan: JAHANNA RAETHER is a 76 y.o. woman with long history outlined above of vaginal dysplasia with recent Pap smear showing high-grade lesion, no findings on vaginoscopy but vaginal biopsy showed high-grade dysplasia.    Discuss previously options  for management, patient amenable to proceed with laser.  Jeral Pinch, MD  Division of Gynecologic Oncology

## 2021-04-27 NOTE — Anesthesia Procedure Notes (Signed)
Procedure Name: LMA Insertion Date/Time: 04/27/2021 2:18 PM Performed by: Rogers Blocker, CRNA Pre-anesthesia Checklist: Patient identified, Emergency Drugs available, Suction available and Patient being monitored Patient Re-evaluated:Patient Re-evaluated prior to induction Oxygen Delivery Method: Circle System Utilized Preoxygenation: Pre-oxygenation with 100% oxygen Induction Type: IV induction Ventilation: Mask ventilation without difficulty LMA: LMA inserted LMA Size: 4.0 Number of attempts: 1 Airway Equipment and Method: Bite block Placement Confirmation: positive ETCO2 Tube secured with: Tape Dental Injury: Teeth and Oropharynx as per pre-operative assessment

## 2021-04-27 NOTE — Discharge Instructions (Addendum)
AFTER SURGERY INSTRUCTIONS   Return to work:  1-2 days if applicable  Use white cream (from hospital) once a day at night (insert inside your vagina with a finger) for 1-2 weeks. Then go back to vaginal estrogen cream 3x a week at night (also using finger to insert in the vagina).   Activity: 1. Be up and out of the bed during the day.  Take a nap if needed.  You may walk up steps but be careful and use the hand rail.  Stair climbing will tire you more than you think, you may need to stop part way and rest.    2. No driving for minimum 24 hours after surgery if you were cleared to drive before surgery. Make sure that your reaction time has returned.    3. You can shower as soon as the next day after surgery. Shower daily. No tub baths or submerging your body in water until cleared by your surgeon.   4. No sexual activity and nothing in the vagina for 4-6 weeks.   5. You may experience vaginal spotting and discharge after surgery.  This can be normal but if you experience heavy bleeding, call our office.   6. Take Tylenol or ibuprofen for pain if you are able to take these medications. Monitor your Tylenol intake to a max of 4,000 mg in a 24 hour period. You had Tylenol at 1:00pm today; you may take the next dose at 7pm or after.  You may take Ibuprofen beginning immediately.   Diet: 1. You are cleared to resume your normal (before surgery) diet after your procedure.   2. It is safe to use a laxative, such as Miralax or Colace, if you have difficulty moving your bowels.    Reasons to call the Doctor: Fever - Oral temperature greater than 100.4 degrees Fahrenheit Foul-smelling vaginal discharge Difficulty urinating Nausea and vomiting Difficulty breathing with or without chest pain New calf pain especially if only on one side Sudden, continuing increased vaginal bleeding with or without clots.   Contacts: For questions or concerns you should contact:   Dr. Jeral Pinch at  [phone]   Joylene John, NP at [phone]   After Hours: call [phone] and have the GYN Oncologist paged/contacted (after 5 pm or on the weekends).   Messages sent via mychart are for non-urgent matters and are not responded to after hours so for urgent needs, please call the after hours number.    Post Anesthesia Home Care Instructions  Activity: Get plenty of rest for the remainder of the day. A responsible individual must stay with you for 24 hours following the procedure.  For the next 24 hours, DO NOT: -Drive a car -Paediatric nurse -Drink alcoholic beverages -Take any medication unless instructed by your physician -Make any legal decisions or sign important papers.  Meals: Start with liquid foods such as gelatin or soup. Progress to regular foods as tolerated. Avoid greasy, spicy, heavy foods. If nausea and/or vomiting occur, drink only clear liquids until the nausea and/or vomiting subsides. Call your physician if vomiting continues.  Special Instructions/Symptoms: Your throat may feel dry or sore from the anesthesia or the breathing tube placed in your throat during surgery. If this causes discomfort, gargle with warm salt water. The discomfort should disappear within 24 hours.

## 2021-04-27 NOTE — Transfer of Care (Signed)
Immediate Anesthesia Transfer of Care Note  Patient: Renee Watkins  Procedure(s) Performed: CO2 LASER APPLICATION TO VAGINA, VAGINAL BIOPSIES (Vagina )  Patient Location: PACU  Anesthesia Type:General  Level of Consciousness: patient cooperative and responds to stimulation  Airway & Oxygen Therapy: Patient Spontanous Breathing  Post-op Assessment: Report given to RN  Post vital signs: Reviewed and stable  Last Vitals:  Vitals Value Taken Time  BP 142/82 04/27/21 1454  Temp    Pulse 70 04/27/21 1458  Resp 11 04/27/21 1458  SpO2 100 % 04/27/21 1458  Vitals shown include unvalidated device data.  Last Pain:  Vitals:   04/27/21 1223  TempSrc: Oral  PainSc: 0-No pain      Patients Stated Pain Goal: 2 (67/28/97 9150)  Complications: No notable events documented.

## 2021-04-27 NOTE — Op Note (Signed)
PATIENT: Ishika Chesterfield DATE: 04/27/21  Preop Diagnosis: VAIN 2-3  Postoperative Diagnosis: same as above  Surgery: CO2 laser of the vagina, vaginal biopsies  Surgeons:  Valarie Cones MD  Assistant: none  Anesthesia: General   Estimated blood loss: 10 ml  IVF:  see I&O flowsheet   Urine output: n/a   Complications: None apparent  Pathology: vaginal cuff biopsies  Operative findings: Shortened vaginal canal. Significant atrophy and scarring at vaginal apex. Minimal diffuse decreased uptake after application of Lugols. No visible lesions.   Procedure: The patient was identified in the preoperative holding area. Informed consent was signed on the chart. Patient was seen history was reviewed and exam was performed.   The patient was then taken to the operating room and placed in the supine position with SCD hose on. General anesthesia was then induced without difficulty. She was then placed in the dorsolithotomy position. The perineum was prepped with Betadine. The vagina was prepped with Betadine. The patient was then draped after the prep was dried.   Timeout was performed the patient, procedure, antibiotic, allergy, and length of procedure. A speculum exam was performed and Lugols was applied to the entire vagina with findings noted above. The subcuticular tissues were infiltrated with 1% lidocaine. Several biopsies were taken from the vaginal apex.  The patient's surgical field was draped with wet towels. The staff and patient ensured laser-safe eyewear and masks were fitted. The laser was set to 8 watts continuous. The laser was tested for accuracy on a tongue depressor.  The laser was applied to the entire upper 2 cm of the vagina circumferentially that had been previously identified. The tissue was ablated to the desired depth and the eschar was removed with a moistened sponge. When the entire area had been ablated the procedure was complete.  Silvadine cream was applied  to the laser site.  All instrument, suture, laparotomy, Ray-Tec, and needle counts were correct x2. The patient tolerated the procedure well and was taken recovery room in stable condition.   Jeral Pinch MD Gynecologic Oncology

## 2021-04-27 NOTE — Anesthesia Preprocedure Evaluation (Signed)
Anesthesia Evaluation  Patient identified by MRN, date of birth, ID band Patient awake    Reviewed: Allergy & Precautions, NPO status , Patient's Chart, lab work & pertinent test results  Airway Mallampati: II  TM Distance: >3 FB   Mouth opening: Limited Mouth Opening  Dental  (+) Teeth Intact, Dental Advisory Given, Caps, Chipped   Pulmonary neg pulmonary ROS,    breath sounds clear to auscultation       Cardiovascular hypertension, Pt. on medications  Rhythm:Regular Rate:Normal     Neuro/Psych negative neurological ROS  negative psych ROS   GI/Hepatic negative GI ROS, Neg liver ROS,   Endo/Other  negative endocrine ROS  Renal/GU negative Renal ROS     Musculoskeletal negative musculoskeletal ROS (+)   Abdominal Normal abdominal exam  (+)   Peds  Hematology negative hematology ROS (+)   Anesthesia Other Findings   Reproductive/Obstetrics                             Lab Results  Component Value Date   WBC 6.9 08/16/2017   HGB 11.4 (L) 08/16/2017   HCT 35.7 (L) 08/16/2017   MCV 92.2 08/16/2017   PLT 330 08/16/2017   Lab Results  Component Value Date   CREATININE 0.56 08/16/2017   BUN 13 08/16/2017   NA 135 08/16/2017   K 3.9 08/16/2017   CL 102 08/16/2017   CO2 25 08/16/2017   No results found for: INR, PROTIME  EKG: normal sinus rhythm.  Anesthesia Physical  Anesthesia Plan  ASA: 2  Anesthesia Plan: General   Post-op Pain Management:    Induction: Intravenous  PONV Risk Score and Plan: 4 or greater and Ondansetron, Dexamethasone, Treatment may vary due to age or medical condition and Midazolam  Airway Management Planned: LMA  Additional Equipment: None  Intra-op Plan:   Post-operative Plan: Extubation in OR  Informed Consent: I have reviewed the patients History and Physical, chart, labs and discussed the procedure including the risks, benefits and  alternatives for the proposed anesthesia with the patient or authorized representative who has indicated his/her understanding and acceptance.     Dental advisory given  Plan Discussed with: CRNA and Anesthesiologist  Anesthesia Plan Comments:         Anesthesia Quick Evaluation

## 2021-04-28 ENCOUNTER — Telehealth: Payer: Self-pay

## 2021-04-28 NOTE — Telephone Encounter (Signed)
Spoke with patients daughter Renee Watkins this morning. She states Renee Watkins is eating, drinking and urinating well. She has not had a BM yet but is passing gas. Encouraged her to drink plenty of water and to take a stool softener if needed. She denies fever or chills. She reports patient having minimal vaginal discharge last night.. She reports the patient is not in pain. She did take 500 mg of tylenol last night but is doing well today. " We did not use the silvadene cream last night as it appeared the container look like it had been tampered with. The seal was missing and some of the cream was gone." Advised her that it was opened and used after the surgery by Dr. Berline Lopes. It is ok to proceed with using the cream. She verbalized understanding.   Instructed to call office with any fever, chills, purulent drainage, uncontrolled pain or any other questions or concerns. Patient verbalizes understanding.   Pt aware of post op appointments as well as the office number [phone] and after hours number [phone] to call if she has any questions or concerns

## 2021-04-28 NOTE — Anesthesia Postprocedure Evaluation (Signed)
Anesthesia Post Note  Patient: Renee Watkins  Procedure(s) Performed: CO2 LASER APPLICATION TO VAGINA, VAGINAL BIOPSIES (Vagina )     Patient location during evaluation: PACU Anesthesia Type: General Level of consciousness: awake and alert Pain management: pain level controlled Vital Signs Assessment: post-procedure vital signs reviewed and stable Respiratory status: spontaneous breathing, nonlabored ventilation, respiratory function stable and patient connected to nasal cannula oxygen Cardiovascular status: blood pressure returned to baseline and stable Postop Assessment: no apparent nausea or vomiting Anesthetic complications: no   No notable events documented.  Last Vitals:  Vitals:   04/27/21 1530 04/27/21 1600  BP: (!) 145/81 (!) 151/73  Pulse: 73 73  Resp: 17 16  Temp: (!) 36.4 C (!) 36.4 C  SpO2: 100% 100%    Last Pain:  Vitals:   04/28/21 1102  TempSrc:   PainSc: 3    Pain Goal: Patients Stated Pain Goal: 2 (04/27/21 1223)                 Alveena Taira

## 2021-04-29 ENCOUNTER — Encounter (HOSPITAL_BASED_OUTPATIENT_CLINIC_OR_DEPARTMENT_OTHER): Payer: Self-pay | Admitting: Gynecologic Oncology

## 2021-04-29 LAB — SURGICAL PATHOLOGY

## 2021-04-30 ENCOUNTER — Encounter: Payer: Self-pay | Admitting: Gynecologic Oncology

## 2021-05-05 DIAGNOSIS — M810 Age-related osteoporosis without current pathological fracture: Secondary | ICD-10-CM | POA: Diagnosis not present

## 2021-06-01 ENCOUNTER — Encounter: Payer: Self-pay | Admitting: Gynecologic Oncology

## 2021-06-01 DIAGNOSIS — Z0000 Encounter for general adult medical examination without abnormal findings: Secondary | ICD-10-CM | POA: Diagnosis not present

## 2021-06-01 DIAGNOSIS — R609 Edema, unspecified: Secondary | ICD-10-CM | POA: Diagnosis not present

## 2021-06-01 DIAGNOSIS — M810 Age-related osteoporosis without current pathological fracture: Secondary | ICD-10-CM | POA: Diagnosis not present

## 2021-06-01 DIAGNOSIS — I10 Essential (primary) hypertension: Secondary | ICD-10-CM | POA: Diagnosis not present

## 2021-06-01 DIAGNOSIS — Z1389 Encounter for screening for other disorder: Secondary | ICD-10-CM | POA: Diagnosis not present

## 2021-06-04 DIAGNOSIS — H5213 Myopia, bilateral: Secondary | ICD-10-CM | POA: Diagnosis not present

## 2021-06-07 ENCOUNTER — Encounter: Payer: Self-pay | Admitting: Gynecologic Oncology

## 2021-06-07 ENCOUNTER — Other Ambulatory Visit: Payer: Self-pay

## 2021-06-07 ENCOUNTER — Encounter: Payer: Medicare HMO | Admitting: Gynecologic Oncology

## 2021-06-07 ENCOUNTER — Inpatient Hospital Stay: Payer: Medicare HMO | Attending: Gynecologic Oncology | Admitting: Gynecologic Oncology

## 2021-06-07 VITALS — BP 148/69 | HR 95 | Temp 98.2°F | Resp 16 | Ht 62.01 in | Wt 107.0 lb

## 2021-06-07 DIAGNOSIS — R87613 High grade squamous intraepithelial lesion on cytologic smear of cervix (HGSIL): Secondary | ICD-10-CM

## 2021-06-07 DIAGNOSIS — Z7189 Other specified counseling: Secondary | ICD-10-CM

## 2021-06-07 DIAGNOSIS — Z9889 Other specified postprocedural states: Secondary | ICD-10-CM

## 2021-06-07 DIAGNOSIS — N952 Postmenopausal atrophic vaginitis: Secondary | ICD-10-CM

## 2021-06-07 DIAGNOSIS — D072 Carcinoma in situ of vagina: Secondary | ICD-10-CM

## 2021-06-07 NOTE — Patient Instructions (Signed)
You are healing well from surgery.  You do not need to use the white burn cream anymore.  You can use the vaginal cream 3 times a week at night.  Place a small amount on your fingertip and inserted into the vagina. ? ?I will see you for follow-up in 6 months.  Please call back in July or August to get a visit scheduled with me in September. ? ?If you have any vaginal pain, bleeding, discharge, or other concerning symptoms before then, please call to see me sooner. ?

## 2021-06-07 NOTE — Progress Notes (Signed)
Gynecologic Oncology Return Clinic Visit ? ?06/07/2021 ? ?Reason for Visit: Follow-up after recent surgery, treatment discussion ? ?Treatment History: ?Renee Watkins is a 76 year old woman who is seen in consultation at the request of Dr Talbert Nan for vaginal dysplasia and agglutination. ?  ?The patient has a long standing history of abnormal paps and high risk HPV. ?  ?She had a pap in August 2016 which showed ASCUS with high risk HPV positive. Cytology was negaive in 2017 but high risk HPV remained present.  ?On 11/08/16 she underwent a pap which showed ASC-H with positive high risk HPV. Colposcopy of the vagina on 11/23/16 showed an agglutinated upper vagina and the cervix could not be visualized. However, there was an area of increased pigmentation of the vagina just under the fusion at 6 o'clock and a biopsy was taken. There was decreased lugols staining throughout the vagina and a biopsy was taken at 12 o'clock. ?  ?These biopsies revealed "CIN 2-3". ?  ?A TVUS was obtained on 12/20/16 to identify the cervix as none could be seen on exam. ?The US showed a 6.8x3.6x3.2cm uterus with a thin endometrium. The cervix was grossly normal on [place].  ?  ?She was treated with 5-FU vaginally for 2 months in December and January, 2019. ?She then underwent repeat pap on 06/15/17 which showed VAIN/CIN 2-3 with high risk HPV detected.  ?  ?She underwent robotic assisted modified radical (Type II) hysterectomy and upper vaginectomy on 08/15/17. ?Final pathology showed a focal high grade squamous intrapeithelial lesions (CIN III) in the cervix, but negative vaginal margins. The ovaries and tubes were benign. ?  ?Laurella had follow-up with Dr Talbert Nan, initially having normal paps after her procedure. ?  ?Vaginal pap on 11/14/19 showed HGSIL.  ?Vaginal biopsy on 11/27/19 showed atrophic squamous mucosa.  ?No interventions took place after these results.  ?  ?Seen in 07/2020 -normal colposcopic exam.  No lesion amenable to resection or laser.  Plan  was to treat with 6 months of vaginal estrogen and repeat Pap in November.  If high-grade dysplasia persisted, consideration for either 5-FU or laser of the entire vaginal mucosa was discussed. ?  ?01/20/21: Vaginal pap showed HSIL ?  ?02/10/21: Vaginoscopy performed with no significant decreased uptake after application of Lugol's.  Random biopsy taken from the midportion of the cuff.  Biopsy showed the VAIN 2-3. ? ?04/27/2021: Vaginal biopsies, CO2 laser of the vagina.  Findings were notable for shortened vaginal canal, significant atrophy and scarring at vaginal apex.  Minimal diffuse decreased uptake noted after application of Lugol's.  No visible lesions. ? ?Interval History: ?Patient reports doing well after surgery.  She notes initially some yellow/brown discharge which has stopped.  She had some irritation around her urethra for which she used Silvadene and then some Vaseline.  She is now using estrogen again.  She endorses normal bowel bladder function.  Denies any vaginal bleeding. ? ?Presents with her daughter today. ? ?Past Medical/Surgical History: ?Past Medical History:  ?Diagnosis Date  ? Abnormal Pap smear of cervix 09/30/2013  ? neg pap HPV HR +, 8-10-16ASCUS HPV HR+, 8/17 neg HPV HR+  ? Bilateral artificial lens implant   ? Hypertension   ? Osteoporosis   ? STD (sexually transmitted disease)   ? HSV  ? Vaginal intraepithelial neoplasia III (VAIN III)   ? ? ?Past Surgical History:  ?Procedure Laterality Date  ? Base Wedge Osteomy Right 03/10/10  ? right foot  ? BUNIONECTOMY Right 03/08/12  ?  Keller right foot  ? Capsulotomy IPJ Right 03/08/12  ? Right foot 2  ? CATARACT EXTRACTION    ? both eyes  ? CO2 LASER APPLICATION N/A 0/0/9233  ? Procedure: CO2 LASER APPLICATION TO VAGINA, VAGINAL BIOPSIES;  Surgeon: Lafonda Mosses, MD;  Location: [facility];  Service: Gynecology;  Laterality: N/A;  ? COLONOSCOPY WITH PROPOFOL N/A 07/08/2014  ? Procedure: COLONOSCOPY WITH PROPOFOL;  Surgeon:  Garlan Fair, MD;  Location: [facility];  Service: Endoscopy;  Laterality: N/A;  ? COLPOSCOPY    ? 2015, ECC neg, polyp neg, 2016 & 2017  ? Thedora Hinders w/graft Right 03/10/10  ? right foot  ? FOOT SURGERY Right 2000, [id number]  ? FOOT SURGERY Left 2002, 2006, 2010  ? Hammer toe repair Right 03/10/10  ? right foot # 5  ? Hammer toe repair Left 03/02/11  ? left foot 3 . 5  ? MANDIBLE FRACTURE SURGERY    ? ROBOTIC ASSISTED TOTAL HYSTERECTOMY N/A 08/15/2017  ? Procedure: XI ROBOTIC ASSISTED MODIFIED RADICAL TOTAL HYSTERECTOMY WITH UPPER VAGINECTOMY;  Surgeon: Everitt Amber, MD;  Location: [facility];  Service: Gynecology;  Laterality: N/A;  ? TUBAL LIGATION    ? ? ?Family History  ?Problem Relation Age of Onset  ? Hypertension Sister   ? Leukemia Sister   ? Hypertension Brother   ? Hypertension Mother   ? ? ?Social History  ? ?Socioeconomic History  ? Marital status: Divorced  ?  Spouse name: Not on file  ? Number of children: Not on file  ? Years of education: Not on file  ? Highest education level: Not on file  ?Occupational History  ? Not on file  ?Tobacco Use  ? Smoking status: Never  ? Smokeless tobacco: Never  ?Vaping Use  ? Vaping Use: Never used  ?Substance and Sexual Activity  ? Alcohol use: No  ? Drug use: No  ? Sexual activity: Not Currently  ?  Birth control/protection: Surgical  ?  Comment: BTL, hysterectomy  ?Other Topics Concern  ? Not on file  ?Social History Narrative  ? Not on file  ? ?Social Determinants of Health  ? ?Financial Resource Strain: Not on file  ?Food Insecurity: Not on file  ?Transportation Needs: Not on file  ?Physical Activity: Not on file  ?Stress: Not on file  ?Social Connections: Not on file  ? ? ?Current Medications: ? ?Current Outpatient Medications:  ?  acetaminophen (TYLENOL) 500 MG tablet, Take 500 mg by mouth every 6 (six) hours as needed., Disp: , Rfl:  ?  CALCIUM PO, Take 1 tablet by mouth 2 (two) times daily. 500 mg per, Disp: , Rfl:  ?  Cholecalciferol (VITAMIN D) 2000 units  tablet, Take 2,000 Int'l Units by mouth 3 (three) times a week., Disp: , Rfl:  ?  denosumab (PROLIA) 60 MG/ML SOSY injection, Inject 60 mg into the skin every 6 (six) months., Disp: , Rfl:  ?  estradiol (ESTRACE VAGINAL) 0.1 MG/GM vaginal cream, Place 1 Applicatorful vaginally 3 (three) times a week., Disp: 42.5 g, Rfl: 12 ?  furosemide (LASIX) 40 MG tablet, Take 40 mg by mouth daily., Disp: , Rfl:  ?  KLOR-CON M10 10 MEQ tablet, Take 10 mEq by mouth daily., Disp: , Rfl:  ?  lisinopril (PRINIVIL,ZESTRIL) 10 MG tablet, Take 10 mg by mouth every morning. , Disp: , Rfl:  ?  Multiple Vitamin (MULTIVITAMIN) tablet, Take 1 tablet by mouth daily., Disp: , Rfl:  ?  Multiple  Vitamins-Minerals (OCUVITE PO), Take by mouth daily., Disp: , Rfl:  ?  valACYclovir (VALTREX) 500 MG tablet, TAKE 1 TABLET BY MOUTH TWICE A DAY FOR 3 DAYS AS NEEDED, Disp: 180 tablet, Rfl: 1 ?  Vitamin D, Ergocalciferol, (DRISDOL) 50000 UNITS CAPS, Take 50,000 Units by mouth every 30 (thirty) days., Disp: , Rfl:  ? ?Review of Systems: ?Denies appetite changes, fevers, chills, fatigue, unexplained weight changes. ?Denies hearing loss, neck lumps or masses, mouth sores, ringing in ears or voice changes. ?Denies cough or wheezing.  Denies shortness of breath. ?Denies chest pain or palpitations. Denies leg swelling. ?Denies abdominal distention, pain, blood in stools, constipation, diarrhea, nausea, vomiting, or early satiety. ?Denies pain with intercourse, dysuria, frequency, hematuria or incontinence. ?Denies hot flashes, pelvic pain, vaginal bleeding or vaginal discharge.   ?Denies joint pain, back pain or muscle pain/cramps. ?Denies itching, rash, or wounds. ?Denies dizziness, headaches, numbness or seizures. ?Denies swollen lymph nodes or glands, denies easy bruising or bleeding. ?Denies anxiety, depression, confusion, or decreased concentration. ? ?Physical Exam: ?BP (!) 148/69 (BP Location: Left Arm, Patient Position: Sitting)   Pulse 95   Temp 98.2  ?F (36.8 ?C) (Oral)   Resp 16   Ht 5' 2.01" (1.575 m)   Wt 107 lb (48.5 kg)   LMP 03/22/2003 (Exact Date)   SpO2 100%   BMI 19.57 kg/m?  ?General: Alert, oriented, no acute distress. ?HEENT: Normocephali

## 2021-06-09 ENCOUNTER — Telehealth: Payer: Self-pay | Admitting: *Deleted

## 2021-06-09 NOTE — Telephone Encounter (Signed)
Star from [employer] called wanting more information for the patient's daughter for FMLA. Explained that the paperwork was for 3/20 for 4 hours, but that the patient will have an appointment every six months.  ?

## 2021-07-20 DIAGNOSIS — I10 Essential (primary) hypertension: Secondary | ICD-10-CM | POA: Diagnosis not present

## 2021-07-20 DIAGNOSIS — E559 Vitamin D deficiency, unspecified: Secondary | ICD-10-CM | POA: Diagnosis not present

## 2021-07-20 DIAGNOSIS — M810 Age-related osteoporosis without current pathological fracture: Secondary | ICD-10-CM | POA: Diagnosis not present

## 2021-07-27 DIAGNOSIS — E559 Vitamin D deficiency, unspecified: Secondary | ICD-10-CM | POA: Diagnosis not present

## 2021-07-27 DIAGNOSIS — I10 Essential (primary) hypertension: Secondary | ICD-10-CM | POA: Diagnosis not present

## 2021-07-27 DIAGNOSIS — M810 Age-related osteoporosis without current pathological fracture: Secondary | ICD-10-CM | POA: Diagnosis not present

## 2021-09-22 DIAGNOSIS — M810 Age-related osteoporosis without current pathological fracture: Secondary | ICD-10-CM | POA: Diagnosis not present

## 2021-10-20 ENCOUNTER — Telehealth: Payer: Self-pay | Admitting: *Deleted

## 2021-10-20 NOTE — Telephone Encounter (Signed)
Attempted to reach the patient to reschedule her appt on 9/18

## 2021-10-22 ENCOUNTER — Telehealth: Payer: Self-pay | Admitting: *Deleted

## 2021-10-22 NOTE — Telephone Encounter (Signed)
Attempted to reach the patient to reschedule her appt on 9/18

## 2021-10-22 NOTE — Telephone Encounter (Signed)
Patient's daughter called and appt for 9/18 was rescheduled to 9/29

## 2021-10-22 NOTE — Telephone Encounter (Signed)
Error

## 2021-11-26 DIAGNOSIS — Z1231 Encounter for screening mammogram for malignant neoplasm of breast: Secondary | ICD-10-CM | POA: Diagnosis not present

## 2021-12-06 ENCOUNTER — Ambulatory Visit: Payer: Medicare HMO | Admitting: Gynecologic Oncology

## 2021-12-15 ENCOUNTER — Encounter: Payer: Self-pay | Admitting: Gynecologic Oncology

## 2021-12-16 NOTE — Progress Notes (Signed)
Gynecologic Oncology Return Clinic Visit  12/17/21  Reason for Visit: Follow-up in the setting of vaginal dysplasia.  Treatment History: The patient has a long standing history of abnormal paps and high risk HPV.   She had a pap in August 2016 which showed ASCUS with high risk HPV positive. Cytology was negaive in 2017 but high risk HPV remained present.  On 11/08/16 she underwent a pap which showed ASC-H with positive high risk HPV. Colposcopy of the vagina on 11/23/16 showed an agglutinated upper vagina and the cervix could not be visualized. However, there was an area of increased pigmentation of the vagina just under the fusion at 6 o'clock and a biopsy was taken. There was decreased lugols staining throughout the vagina and a biopsy was taken at 12 o'clock.   These biopsies revealed "CIN 2-3".   A TVUS was obtained on 12/20/16 to identify the cervix as none could be seen on exam. The US showed a 6.8x3.6x3.2cm uterus with a thin endometrium. The cervix was grossly normal on [place].    She was treated with 5-FU vaginally for 2 months in December and January, 2019. She then underwent repeat pap on 06/15/17 which showed VAIN/CIN 2-3 with high risk HPV detected.    She underwent robotic assisted modified radical (Type II) hysterectomy and upper vaginectomy on 08/15/17. Final pathology showed a focal high grade squamous intrapeithelial lesions (CIN III) in the cervix, but negative vaginal margins. The ovaries and tubes were benign.   Millee had follow-up with Dr Talbert Nan, initially having normal paps after her procedure.   Vaginal pap on 11/14/19 showed HGSIL.  Vaginal biopsy on 11/27/19 showed atrophic squamous mucosa.  No interventions took place after these results.    Seen in 07/2020 -normal colposcopic exam.  No lesion amenable to resection or laser.  Plan was to treat with 6 months of vaginal estrogen and repeat Pap in November.  If high-grade dysplasia persisted, consideration for either 5-FU or  laser of the entire vaginal mucosa was discussed.   01/20/21: Vaginal pap showed HSIL   02/10/21: Vaginoscopy performed with no significant decreased uptake after application of Lugol's.  Random biopsy taken from the midportion of the cuff.  Biopsy showed the VAIN 2-3.   04/27/2021: Vaginal biopsies, CO2 laser of the vagina.  Findings were notable for shortened vaginal canal, significant atrophy and scarring at vaginal apex.  Minimal diffuse decreased uptake noted after application of Lugol's.  No visible lesions.  Interval History: Doing well.  Denies any bleeding, discharge, pain, or pruritus.  Has been using the vaginal estrogen cream 3 times a week at night.  Reports normal bowel and bladder function.  Past Medical/Surgical History: Past Medical History:  Diagnosis Date   Abnormal Pap smear of cervix 09/30/2013   neg pap HPV HR +, 8-10-16ASCUS HPV HR+, 8/17 neg HPV HR+   Bilateral artificial lens implant    Hypertension    Osteoporosis    STD (sexually transmitted disease)    HSV   Vaginal intraepithelial neoplasia III (VAIN III)     Past Surgical History:  Procedure Laterality Date   Base Wedge Osteomy Right 03/10/10   right foot   BUNIONECTOMY Right 03/08/12   Keller right foot   Capsulotomy IPJ Right 03/08/12   Right foot 2   CATARACT EXTRACTION     both eyes   CO2 LASER APPLICATION N/A 05/21/2023   Procedure: CO2 LASER APPLICATION TO VAGINA, VAGINAL BIOPSIES;  Surgeon: Lafonda Mosses, MD;  Location: [facility];  Service: Gynecology;  Laterality: N/A;   COLONOSCOPY WITH PROPOFOL N/A 07/08/2014   Procedure: COLONOSCOPY WITH PROPOFOL;  Surgeon: Garlan Fair, MD;  Location: [facility];  Service: Endoscopy;  Laterality: N/A;   COLPOSCOPY     2015, ECC neg, polyp neg, 2016 & 2017   [place] w/graft Right 03/10/10   right foot   FOOT SURGERY Right 2000, [id number]   FOOT SURGERY Left 2002, 2006, 2010   Hammer toe repair Right 03/10/10   right foot #  5   Hammer toe repair Left 03/02/11   left foot 3 . 5   MANDIBLE FRACTURE SURGERY     ROBOTIC ASSISTED TOTAL HYSTERECTOMY N/A 08/15/2017   Procedure: XI ROBOTIC ASSISTED MODIFIED RADICAL TOTAL HYSTERECTOMY WITH UPPER VAGINECTOMY;  Surgeon: Everitt Amber, MD;  Location: [facility];  Service: Gynecology;  Laterality: N/A;   TUBAL LIGATION      Family History  Problem Relation Age of Onset   Hypertension Mother    Hypertension Sister    Leukemia Sister    Breast cancer Sister    Hypertension Brother    Colon cancer Neg Hx    Ovarian cancer Neg Hx    Endometrial cancer Neg Hx    Pancreatic cancer Neg Hx    Prostate cancer Neg Hx     Social History   Socioeconomic History   Marital status: Divorced    Spouse name: Not on file   Number of children: Not on file   Years of education: Not on file   Highest education level: Not on file  Occupational History   Not on file  Tobacco Use   Smoking status: Never   Smokeless tobacco: Never  Vaping Use   Vaping Use: Never used  Substance and Sexual Activity   Alcohol use: No   Drug use: No   Sexual activity: Not Currently    Birth control/protection: Surgical    Comment: BTL, hysterectomy  Other Topics Concern   Not on file  Social History Narrative   Not on file   Social Determinants of Health   Financial Resource Strain: Not on file  Food Insecurity: Not on file  Transportation Needs: Not on file  Physical Activity: Not on file  Stress: Not on file  Social Connections: Not on file    Current Medications:  Current Outpatient Medications:    acetaminophen (TYLENOL) 500 MG tablet, Take 500 mg by mouth every 6 (six) hours as needed., Disp: , Rfl:    CALCIUM PO, Take 1 tablet by mouth 2 (two) times daily. 500 mg per, Disp: , Rfl:    Cholecalciferol (VITAMIN D) 2000 units tablet, Take 2,000 Int'l Units by mouth 3 (three) times a week., Disp: , Rfl:    estradiol (ESTRACE VAGINAL) 0.1 MG/GM vaginal cream, Place 1 Applicatorful  vaginally 3 (three) times a week., Disp: 42.5 g, Rfl: 12   FORTEO 600 MCG/2.4ML SOPN, Inject into the skin., Disp: , Rfl:    furosemide (LASIX) 40 MG tablet, Take 40 mg by mouth daily., Disp: , Rfl:    KLOR-CON M10 10 MEQ tablet, Take 10 mEq by mouth daily., Disp: , Rfl:    lisinopril (PRINIVIL,ZESTRIL) 10 MG tablet, Take 10 mg by mouth every morning. , Disp: , Rfl:    Multiple Vitamin (MULTIVITAMIN) tablet, Take 1 tablet by mouth daily., Disp: , Rfl:    Multiple Vitamins-Minerals (OCUVITE PO), Take by mouth daily., Disp: , Rfl:    valACYclovir (VALTREX) 500 MG tablet,  TAKE 1 TABLET BY MOUTH TWICE A DAY FOR 3 DAYS AS NEEDED, Disp: 180 tablet, Rfl: 1   Vitamin D, Ergocalciferol, (DRISDOL) 50000 UNITS CAPS, Take 50,000 Units by mouth every 30 (thirty) days., Disp: , Rfl:   Review of Systems: Denies appetite changes, fevers, chills, fatigue, unexplained weight changes. Denies hearing loss, neck lumps or masses, mouth sores, ringing in ears or voice changes. Denies cough or wheezing.  Denies shortness of breath. Denies chest pain or palpitations. Denies leg swelling. Denies abdominal distention, pain, blood in stools, constipation, diarrhea, nausea, vomiting, or early satiety. Denies pain with intercourse, dysuria, frequency, hematuria or incontinence. Denies hot flashes, pelvic pain, vaginal bleeding or vaginal discharge.   Denies joint pain, back pain or muscle pain/cramps. Denies itching, rash, or wounds. Denies dizziness, headaches, numbness or seizures. Denies swollen lymph nodes or glands, denies easy bruising or bleeding. Denies anxiety, depression, confusion, or decreased concentration.  Physical Exam: BP 136/68 (BP Location: Left Arm, Patient Position: Sitting)   Pulse 91   Temp 98.3 F (36.8 C) (Oral)   Resp 16   Ht 5' (1.524 m)   Wt 108 lb (49 kg)   LMP 03/22/2003 (Exact Date)   SpO2 99%   BMI 21.09 kg/m  General: Alert, oriented, no acute distress. HEENT: Normocephalic,  atraumatic, sclera anicteric. Chest: Unlabored breathing on room air. Extremities: Grossly normal range of motion.  Warm, well perfused.  No edema bilaterally. Skin: No rashes or lesions noted. GU: Normal appearing external genitalia without erythema, excoriation, or lesions.  Speculum exam reveals moderate atrophy of the vagina, no discharge or bleeding.  Atrophy noted at the vaginal apex but no lesions or atypical vascularity noted.  Bimanual exam reveals no nodularity, cuff intact.  Vagina is foreshortened measuring approximately 4 cm.    Laboratory & Radiologic Studies: None new  Assessment & Plan: AIMY SWEETING is a 76 y.o. woman with long history outlined above of vaginal dysplasia with recent Pap smear showing high-grade lesion, biopsy confirmed VAIN 2-3 during recent visit, now status post vaginal CO2 laser ablation.   Patient is doing well, is NED on exam today.  Continues to use vaginal estrogen.   Plan to see her back in 6 months for repeat exam and cotesting.  20 minutes of total time was spent for this patient encounter, including preparation, face-to-face counseling with the patient and coordination of care, and documentation of the encounter.  Jeral Pinch, MD  Division of Gynecologic Oncology  Department of Obstetrics and Gynecology  [facility]

## 2021-12-17 ENCOUNTER — Other Ambulatory Visit: Payer: Self-pay

## 2021-12-17 ENCOUNTER — Inpatient Hospital Stay: Payer: Medicare HMO | Attending: Gynecologic Oncology | Admitting: Gynecologic Oncology

## 2021-12-17 VITALS — BP 136/68 | HR 91 | Temp 98.3°F | Resp 16 | Ht 60.0 in | Wt 108.0 lb

## 2021-12-17 DIAGNOSIS — R69 Illness, unspecified: Secondary | ICD-10-CM | POA: Diagnosis not present

## 2021-12-17 DIAGNOSIS — D072 Carcinoma in situ of vagina: Secondary | ICD-10-CM | POA: Insufficient documentation

## 2021-12-17 DIAGNOSIS — Z79890 Hormone replacement therapy (postmenopausal): Secondary | ICD-10-CM | POA: Diagnosis not present

## 2021-12-17 DIAGNOSIS — R87810 Cervical high risk human papillomavirus (HPV) DNA test positive: Secondary | ICD-10-CM | POA: Insufficient documentation

## 2021-12-17 NOTE — Patient Instructions (Signed)
It was good to see you today.  I do not see any spots concerning for precancer or cancer.  Please keep using the vaginal estrogen 3 times a week.  Please call in December or January to schedule a visit to see me in March.  As always, please let me know if you develop any symptoms between now and your next visit.

## 2022-01-19 DIAGNOSIS — L02416 Cutaneous abscess of left lower limb: Secondary | ICD-10-CM

## 2022-01-19 DIAGNOSIS — L03116 Cellulitis of left lower limb: Secondary | ICD-10-CM

## 2022-01-19 HISTORY — DX: Cutaneous abscess of left lower limb: L02.416

## 2022-01-19 HISTORY — DX: Cellulitis of left lower limb: L03.116

## 2022-02-07 ENCOUNTER — Ambulatory Visit (HOSPITAL_COMMUNITY)
Admission: RE | Admit: 2022-02-07 | Discharge: 2022-02-07 | Disposition: A | Discharge status: Home / Self Care | Payer: Medicare HMO | Source: Ambulatory Visit | Attending: Cardiology | Admitting: Cardiology

## 2022-02-07 ENCOUNTER — Other Ambulatory Visit (HOSPITAL_COMMUNITY): Payer: Self-pay | Admitting: Physician Assistant

## 2022-02-07 DIAGNOSIS — D649 Anemia, unspecified: Secondary | ICD-10-CM | POA: Diagnosis not present

## 2022-02-07 DIAGNOSIS — M7989 Other specified soft tissue disorders: Secondary | ICD-10-CM | POA: Diagnosis not present

## 2022-02-07 DIAGNOSIS — L03116 Cellulitis of left lower limb: Secondary | ICD-10-CM | POA: Diagnosis not present

## 2022-02-09 DIAGNOSIS — R590 Localized enlarged lymph nodes: Secondary | ICD-10-CM | POA: Diagnosis not present

## 2022-02-09 DIAGNOSIS — L03116 Cellulitis of left lower limb: Secondary | ICD-10-CM | POA: Diagnosis not present

## 2022-02-16 DIAGNOSIS — L03116 Cellulitis of left lower limb: Secondary | ICD-10-CM | POA: Diagnosis not present

## 2022-02-18 DIAGNOSIS — L03116 Cellulitis of left lower limb: Secondary | ICD-10-CM | POA: Diagnosis not present

## 2022-02-22 DIAGNOSIS — L03116 Cellulitis of left lower limb: Secondary | ICD-10-CM | POA: Diagnosis not present

## 2022-02-22 IMAGING — CR DG CHEST 2V
2 series · 2 of 2 positions shown · non-contrast
Comparison: None.

CLINICAL DATA: Shortness of breath with exertion

EXAM:
CHEST - 2 VIEW

[w chest pa]
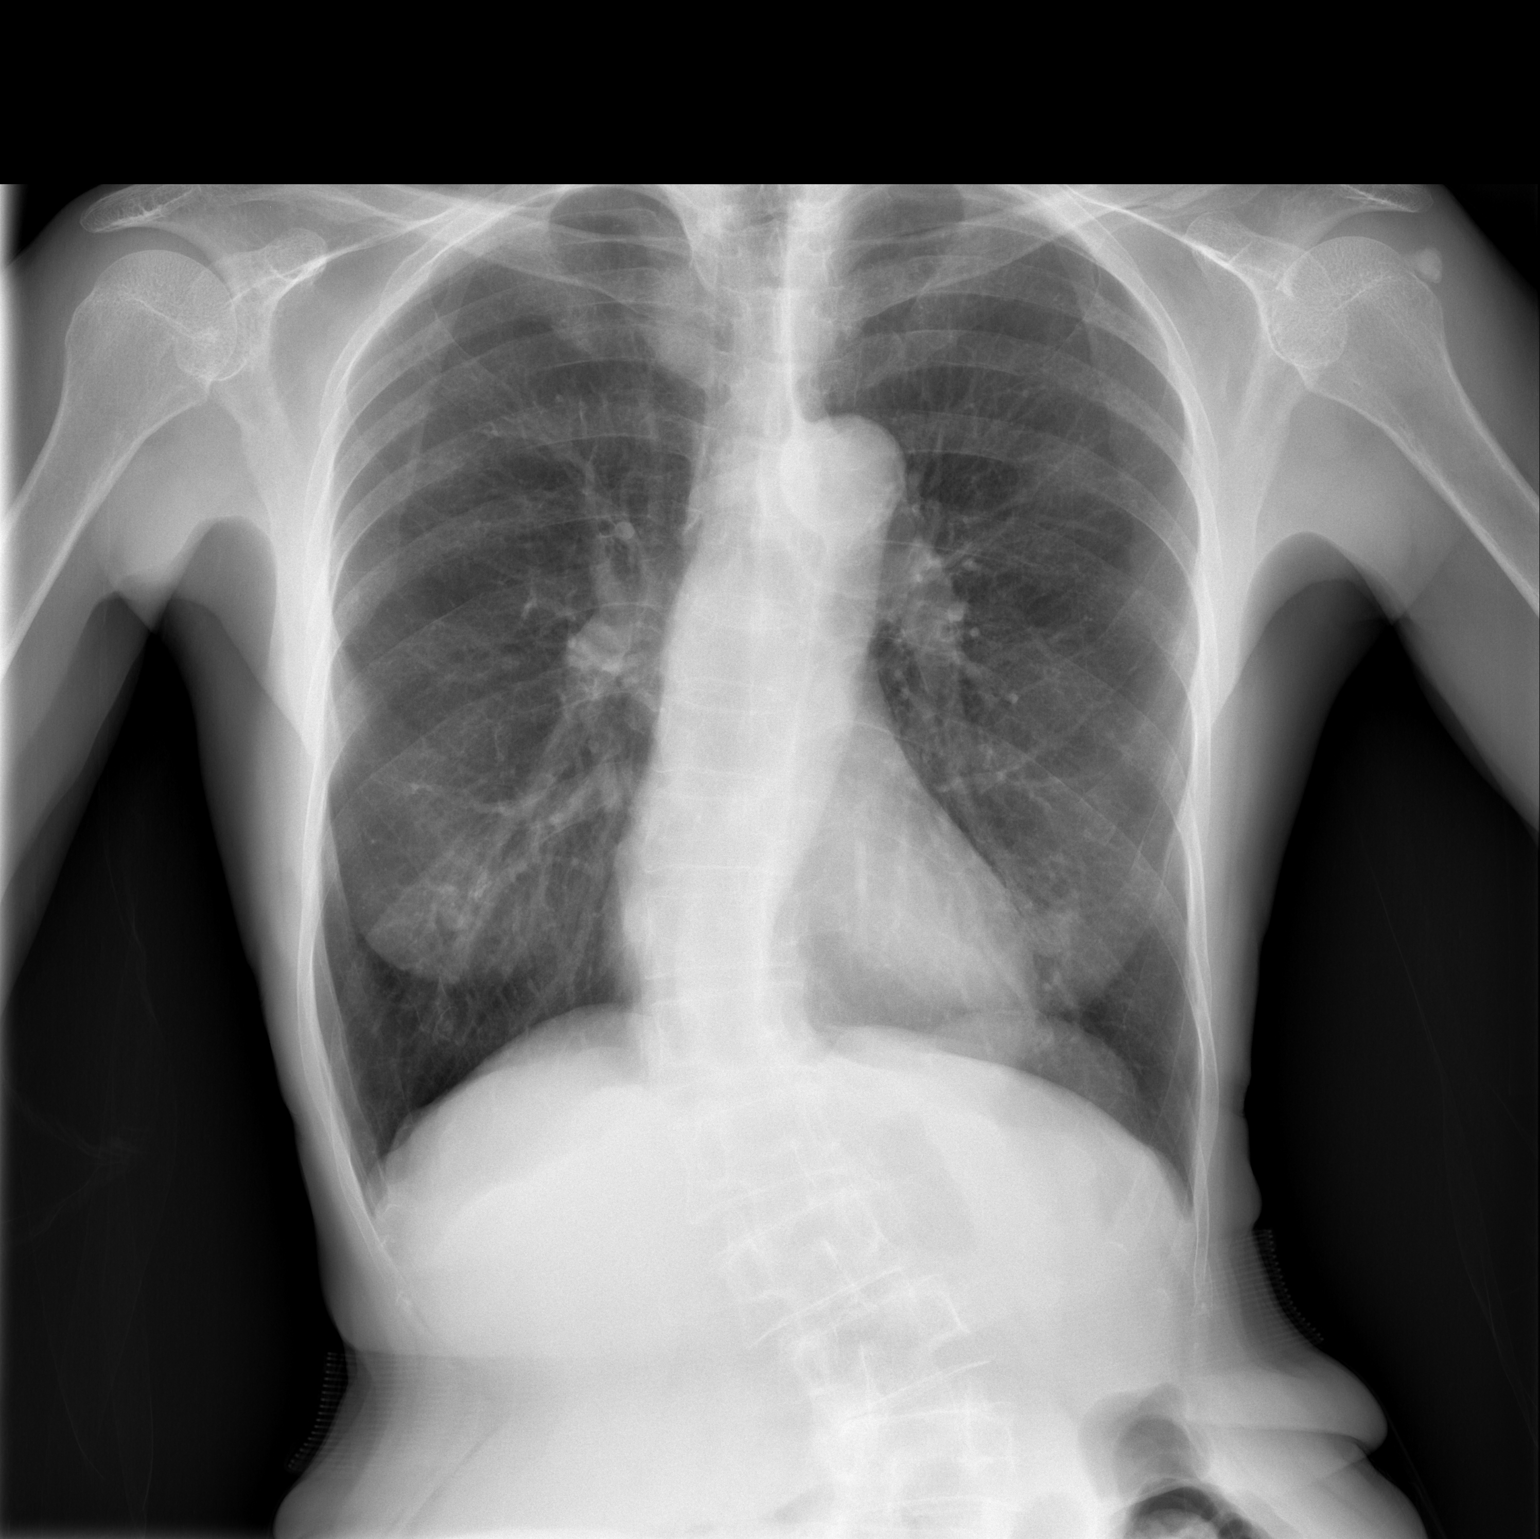

[w chest lat]
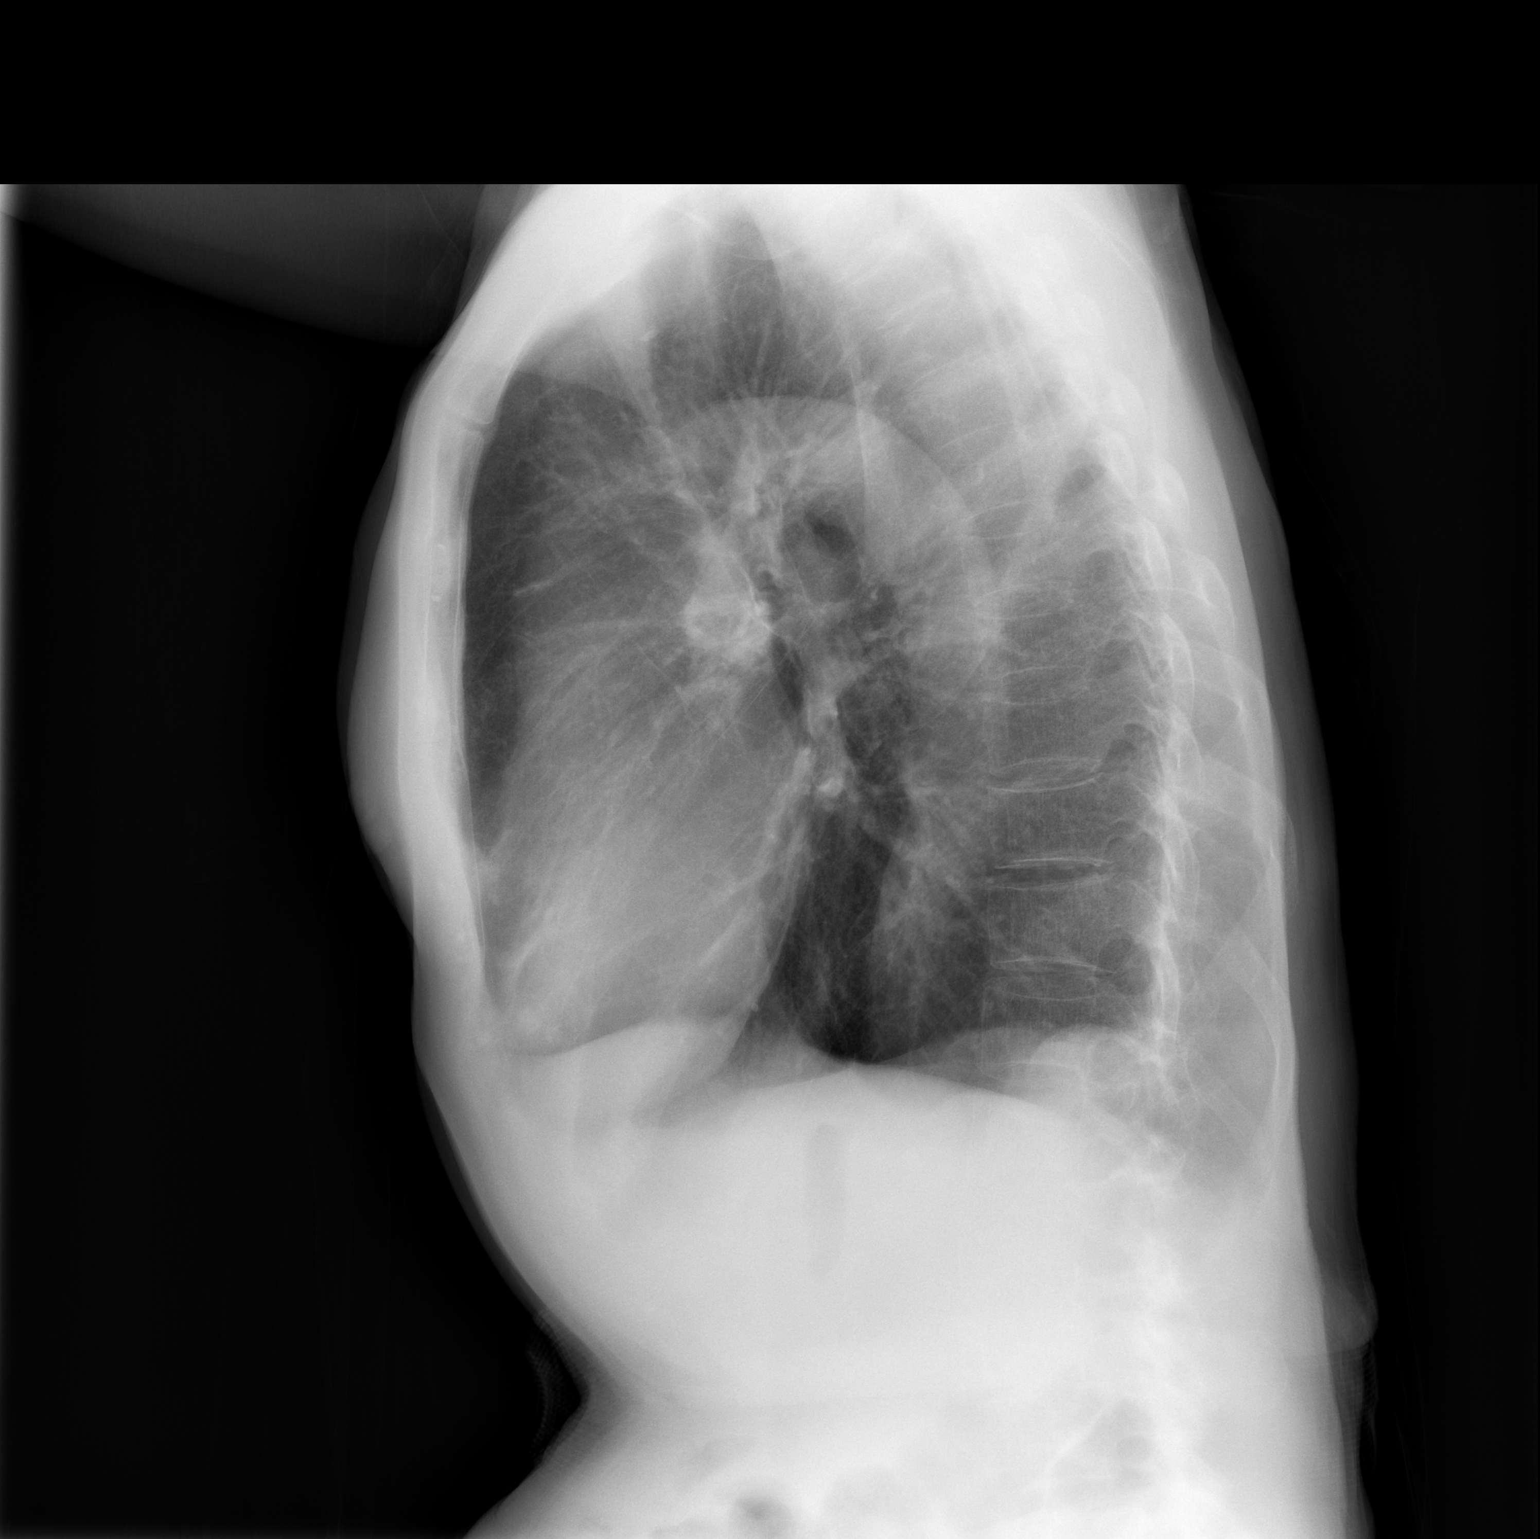

[2 of 2 positions shown; findings below may reference images not displayed]

FINDINGS: There is hyperinflation of the lungs compatible with COPD. Heart and
mediastinal contours are within normal limits. No focal opacities or
effusions. No acute bony abnormality. Thoracolumbar scoliosis.
IMPRESSION: COPD.  No active disease.

## 2022-02-23 DIAGNOSIS — L97222 Non-pressure chronic ulcer of left calf with fat layer exposed: Secondary | ICD-10-CM | POA: Diagnosis not present

## 2022-02-25 DIAGNOSIS — L97222 Non-pressure chronic ulcer of left calf with fat layer exposed: Secondary | ICD-10-CM | POA: Diagnosis not present

## 2022-03-02 DIAGNOSIS — X58XXXD Exposure to other specified factors, subsequent encounter: Secondary | ICD-10-CM | POA: Diagnosis not present

## 2022-03-02 DIAGNOSIS — I87311 Chronic venous hypertension (idiopathic) with ulcer of right lower extremity: Secondary | ICD-10-CM | POA: Diagnosis not present

## 2022-03-02 DIAGNOSIS — S81802D Unspecified open wound, left lower leg, subsequent encounter: Secondary | ICD-10-CM | POA: Diagnosis not present

## 2022-03-02 DIAGNOSIS — I872 Venous insufficiency (chronic) (peripheral): Secondary | ICD-10-CM | POA: Diagnosis not present

## 2022-03-02 DIAGNOSIS — I878 Other specified disorders of veins: Secondary | ICD-10-CM | POA: Diagnosis not present

## 2022-03-02 DIAGNOSIS — L97222 Non-pressure chronic ulcer of left calf with fat layer exposed: Secondary | ICD-10-CM | POA: Diagnosis not present

## 2022-03-03 DIAGNOSIS — I87311 Chronic venous hypertension (idiopathic) with ulcer of right lower extremity: Secondary | ICD-10-CM | POA: Diagnosis not present

## 2022-03-04 DIAGNOSIS — I872 Venous insufficiency (chronic) (peripheral): Secondary | ICD-10-CM | POA: Diagnosis not present

## 2022-03-04 DIAGNOSIS — L97222 Non-pressure chronic ulcer of left calf with fat layer exposed: Secondary | ICD-10-CM | POA: Diagnosis not present

## 2022-03-09 DIAGNOSIS — X58XXXD Exposure to other specified factors, subsequent encounter: Secondary | ICD-10-CM | POA: Diagnosis not present

## 2022-03-09 DIAGNOSIS — L97822 Non-pressure chronic ulcer of other part of left lower leg with fat layer exposed: Secondary | ICD-10-CM | POA: Diagnosis not present

## 2022-03-09 DIAGNOSIS — I872 Venous insufficiency (chronic) (peripheral): Secondary | ICD-10-CM | POA: Diagnosis not present

## 2022-03-09 DIAGNOSIS — I83029 Varicose veins of left lower extremity with ulcer of unspecified site: Secondary | ICD-10-CM | POA: Diagnosis not present

## 2022-03-09 DIAGNOSIS — S81802D Unspecified open wound, left lower leg, subsequent encounter: Secondary | ICD-10-CM | POA: Diagnosis not present

## 2022-03-09 DIAGNOSIS — L97929 Non-pressure chronic ulcer of unspecified part of left lower leg with unspecified severity: Secondary | ICD-10-CM | POA: Diagnosis not present

## 2022-03-30 DIAGNOSIS — L97822 Non-pressure chronic ulcer of other part of left lower leg with fat layer exposed: Secondary | ICD-10-CM | POA: Diagnosis not present

## 2022-03-30 DIAGNOSIS — I87311 Chronic venous hypertension (idiopathic) with ulcer of right lower extremity: Secondary | ICD-10-CM | POA: Diagnosis not present

## 2022-03-30 DIAGNOSIS — I872 Venous insufficiency (chronic) (peripheral): Secondary | ICD-10-CM | POA: Diagnosis not present

## 2022-03-30 DIAGNOSIS — L97222 Non-pressure chronic ulcer of left calf with fat layer exposed: Secondary | ICD-10-CM | POA: Diagnosis not present

## 2022-04-13 DIAGNOSIS — L97222 Non-pressure chronic ulcer of left calf with fat layer exposed: Secondary | ICD-10-CM | POA: Diagnosis not present

## 2022-05-24 ENCOUNTER — Other Ambulatory Visit: Payer: Self-pay | Admitting: Gynecologic Oncology

## 2022-06-06 DIAGNOSIS — R590 Localized enlarged lymph nodes: Secondary | ICD-10-CM | POA: Diagnosis not present

## 2022-06-06 DIAGNOSIS — Z1331 Encounter for screening for depression: Secondary | ICD-10-CM | POA: Diagnosis not present

## 2022-06-06 DIAGNOSIS — R350 Frequency of micturition: Secondary | ICD-10-CM | POA: Diagnosis not present

## 2022-06-06 DIAGNOSIS — I10 Essential (primary) hypertension: Secondary | ICD-10-CM | POA: Diagnosis not present

## 2022-06-06 DIAGNOSIS — Z0000 Encounter for general adult medical examination without abnormal findings: Secondary | ICD-10-CM | POA: Diagnosis not present

## 2022-06-06 DIAGNOSIS — M810 Age-related osteoporosis without current pathological fracture: Secondary | ICD-10-CM | POA: Diagnosis not present

## 2022-06-06 DIAGNOSIS — D060 Carcinoma in situ of endocervix: Secondary | ICD-10-CM | POA: Diagnosis not present

## 2022-06-06 DIAGNOSIS — Z13220 Encounter for screening for lipoid disorders: Secondary | ICD-10-CM | POA: Diagnosis not present

## 2022-06-06 DIAGNOSIS — E559 Vitamin D deficiency, unspecified: Secondary | ICD-10-CM | POA: Diagnosis not present

## 2022-06-06 DIAGNOSIS — Z136 Encounter for screening for cardiovascular disorders: Secondary | ICD-10-CM | POA: Diagnosis not present

## 2022-06-10 DIAGNOSIS — H524 Presbyopia: Secondary | ICD-10-CM | POA: Diagnosis not present

## 2022-06-15 ENCOUNTER — Encounter: Payer: Self-pay | Admitting: Gynecologic Oncology

## 2022-06-16 ENCOUNTER — Encounter: Payer: Self-pay | Admitting: Gynecologic Oncology

## 2022-06-16 ENCOUNTER — Inpatient Hospital Stay: Payer: Medicare HMO | Attending: Gynecologic Oncology | Admitting: Gynecologic Oncology

## 2022-06-16 ENCOUNTER — Other Ambulatory Visit (HOSPITAL_COMMUNITY)
Admission: RE | Admit: 2022-06-16 | Discharge: 2022-06-16 | Disposition: A | Discharge status: Home / Self Care | Payer: Medicare HMO | Source: Ambulatory Visit | Attending: Gynecologic Oncology | Admitting: Gynecologic Oncology

## 2022-06-16 ENCOUNTER — Other Ambulatory Visit: Payer: Self-pay

## 2022-06-16 VITALS — BP 122/49 | HR 96 | Temp 97.5°F | Resp 16 | Ht 60.0 in | Wt 107.4 lb

## 2022-06-16 DIAGNOSIS — R69 Illness, unspecified: Secondary | ICD-10-CM | POA: Diagnosis not present

## 2022-06-16 DIAGNOSIS — N952 Postmenopausal atrophic vaginitis: Secondary | ICD-10-CM

## 2022-06-16 DIAGNOSIS — Z9079 Acquired absence of other genital organ(s): Secondary | ICD-10-CM | POA: Insufficient documentation

## 2022-06-16 DIAGNOSIS — Z1151 Encounter for screening for human papillomavirus (HPV): Secondary | ICD-10-CM | POA: Insufficient documentation

## 2022-06-16 DIAGNOSIS — Z01419 Encounter for gynecological examination (general) (routine) without abnormal findings: Secondary | ICD-10-CM | POA: Insufficient documentation

## 2022-06-16 DIAGNOSIS — D072 Carcinoma in situ of vagina: Secondary | ICD-10-CM | POA: Diagnosis not present

## 2022-06-16 DIAGNOSIS — Z90710 Acquired absence of both cervix and uterus: Secondary | ICD-10-CM | POA: Diagnosis not present

## 2022-06-16 DIAGNOSIS — E7800 Pure hypercholesterolemia, unspecified: Secondary | ICD-10-CM | POA: Diagnosis not present

## 2022-06-16 DIAGNOSIS — R87810 Cervical high risk human papillomavirus (HPV) DNA test positive: Secondary | ICD-10-CM | POA: Insufficient documentation

## 2022-06-16 NOTE — Patient Instructions (Signed)
It was good to see you today.   I will release Pap smear results to you in my chart.  These usually take about a week to come back.  I will see you for follow-up in 6 months.  Please call in June or July to schedule visit to see me in September or October.  As always, if you develop any new and concerning symptoms before your next visit, please call to see me sooner.

## 2022-06-16 NOTE — Progress Notes (Signed)
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   Treatment History: The patient has a long standing history of abnormal paps and high risk HPV.   She had a pap in August 2016 which showed ASCUS with high risk HPV positive. Cytology was negaive in 2017 but high risk HPV remained present.  On 11/08/16 she underwent a pap which showed ASC-H with positive high risk HPV. Colposcopy of the vagina on 11/23/16 showed an agglutinated upper vagina and the cervix could not be visualized. However, there was an area of increased pigmentation of the vagina just under the fusion at 6 o'clock and a biopsy was taken. There was decreased lugols staining throughout the vagina and a biopsy was taken at 12 o'clock.   These biopsies revealed "CIN 2-3".   A TVUS was obtained on 12/20/16 to identify the cervix as none could be seen on exam. The US showed a 6.8x3.6x3.2cm uterus with a thin endometrium. The cervix was grossly normal on [place].    She was treated with 5-FU vaginally for 2 months in December and January, 2019. She then underwent repeat pap on 06/15/17 which showed VAIN/CIN 2-3 with high risk HPV detected.    She underwent robotic assisted modified radical (Type II) hysterectomy and upper vaginectomy on 08/15/17. Final pathology showed a focal high grade squamous intrapeithelial lesions (CIN III) in the cervix, but negative vaginal margins. The ovaries and tubes were benign.   [place] had follow-up with Dr Talbert Nan, initially having normal paps after her procedure.   Vaginal pap on 11/14/19 showed HGSIL.  Vaginal biopsy on 11/27/19 showed atrophic squamous mucosa.  No interventions took place after these results.    Seen in 07/2020 -normal colposcopic exam.  No lesion amenable to resection or laser.  Plan was to treat with 6 months of vaginal estrogen and repeat Pap in November.  If high-grade dysplasia persisted, consideration for either 5-FU or  laser of the entire vaginal mucosa was discussed.   01/20/21: Vaginal pap showed HSIL   02/10/21: Vaginoscopy performed with no significant decreased uptake after application of Lugol's.  Random biopsy taken from the midportion of the cuff.  Biopsy showed the VAIN 2-3.   04/27/2021: Vaginal biopsies, CO2 laser of the vagina.  Findings were notable for shortened vaginal canal, significant atrophy and scarring at vaginal apex.  Minimal diffuse decreased uptake noted after application of Lugol's.  No visible lesions.  Interval History: Patient reports doing well.  Denies any vaginal bleeding, discharge, pain, or pruritus.  Has been using vaginal estrogen 2-3 times a week.  Reports baseline bowel and bladder function.  Past Medical/Surgical History: Past Medical History:  Diagnosis Date   Abnormal Pap smear of cervix 09/30/2013   neg pap HPV HR +, 8-10-16ASCUS HPV HR+, 8/17 neg HPV HR+   Bilateral artificial lens implant    Cellulitis and abscess of left leg 01/2022   Hypertension    Osteoporosis    STD (sexually transmitted disease)    HSV   Vaginal intraepithelial neoplasia III (VAIN III)     Past Surgical History:  Procedure Laterality Date   Base Wedge Osteomy Right 03/10/10   right foot   BUNIONECTOMY Right 03/08/12   Keller right foot   Capsulotomy IPJ Right 03/08/12   Right foot 2   CATARACT EXTRACTION     both eyes   CO2 LASER APPLICATION N/A [id number]   Procedure: CO2 LASER APPLICATION TO VAGINA, VAGINAL BIOPSIES;  Surgeon: Lafonda Mosses, MD;  Location: [facility];  Service: Gynecology;  Laterality: N/A;   COLONOSCOPY WITH PROPOFOL N/A 07/08/2014   Procedure: COLONOSCOPY WITH PROPOFOL;  Surgeon: Garlan Fair, MD;  Location: [facility];  Service: Endoscopy;  Laterality: N/A;   COLPOSCOPY     2015, ECC neg, polyp neg, 2016 & 2017   Bradshaw w/graft Right 03/10/10   right foot   FOOT SURGERY Right 2000, [id number]   FOOT SURGERY Left 2002, 2006,  2010   Hammer toe repair Right 03/10/10   right foot # 5   Hammer toe repair Left 03/02/11   left foot 3 . 5   MANDIBLE FRACTURE SURGERY     ROBOTIC ASSISTED TOTAL HYSTERECTOMY N/A 08/15/2017   Procedure: XI ROBOTIC ASSISTED MODIFIED RADICAL TOTAL HYSTERECTOMY WITH UPPER VAGINECTOMY;  Surgeon: Everitt Amber, MD;  Location: [facility];  Service: Gynecology;  Laterality: N/A;   TUBAL LIGATION      Family History  Problem Relation Age of Onset   Hypertension Mother    Hypertension Sister    Leukemia Sister    Breast cancer Sister    Hypertension Brother    Colon cancer Neg Hx    Ovarian cancer Neg Hx    Endometrial cancer Neg Hx    Pancreatic cancer Neg Hx    Prostate cancer Neg Hx     Social History   Socioeconomic History   Marital status: Divorced    Spouse name: Not on file   Number of children: Not on file   Years of education: Not on file   Highest education level: Not on file  Occupational History   Not on file  Tobacco Use   Smoking status: Never   Smokeless tobacco: Never  Vaping Use   Vaping Use: Never used  Substance and Sexual Activity   Alcohol use: No   Drug use: No   Sexual activity: Not Currently    Birth control/protection: Surgical    Comment: BTL, hysterectomy  Other Topics Concern   Not on file  Social History Narrative   Not on file   Social Determinants of Health   Financial Resource Strain: Not on file  Food Insecurity: Not on file  Transportation Needs: Not on file  Physical Activity: Not on file  Stress: Not on file  Social Connections: Not on file    Current Medications:  Current Outpatient Medications:    acetaminophen (TYLENOL) 500 MG tablet, Take 500 mg by mouth every 6 (six) hours as needed., Disp: , Rfl:    CALCIUM PO, Take 1 tablet by mouth 2 (two) times daily. 500 mg per, Disp: , Rfl:    Cholecalciferol (VITAMIN D) 2000 units tablet, Take 2,000 Int'l Units by mouth 3 (three) times a week., Disp: , Rfl:    estradiol (ESTRACE)  0.1 MG/GM vaginal cream, PLACE 1 APPLICATORFUL VAGINALLY 3 (THREE) TIMES A WEEK., Disp: 42.5 g, Rfl: 12   FORTEO 600 MCG/2.4ML SOPN, Inject into the skin., Disp: , Rfl:    furosemide (LASIX) 40 MG tablet, Take 40 mg by mouth daily., Disp: , Rfl:    KLOR-CON M10 10 MEQ tablet, Take 10 mEq by mouth daily., Disp: , Rfl:    lisinopril (PRINIVIL,ZESTRIL) 10 MG tablet, Take 10 mg by mouth every morning. , Disp: , Rfl:    Multiple Vitamin (MULTIVITAMIN) tablet, Take 1 tablet by mouth daily., Disp: , Rfl:    Multiple Vitamins-Minerals (OCUVITE PO), Take by mouth daily., Disp: , Rfl:  valACYclovir (VALTREX) 500 MG tablet, TAKE 1 TABLET BY MOUTH TWICE A DAY FOR 3 DAYS AS NEEDED, Disp: 180 tablet, Rfl: 1   Vitamin D, Ergocalciferol, (DRISDOL) 50000 UNITS CAPS, Take 50,000 Units by mouth every 30 (thirty) days., Disp: , Rfl:   Review of Systems: Denies appetite changes, fevers, chills, fatigue, unexplained weight changes. Denies hearing loss, neck lumps or masses, mouth sores, ringing in ears or voice changes. Denies cough or wheezing.  Denies shortness of breath. Denies chest pain or palpitations. Denies leg swelling. Denies abdominal distention, pain, blood in stools, constipation, diarrhea, nausea, vomiting, or early satiety. Denies pain with intercourse, dysuria, frequency, hematuria or incontinence. Denies hot flashes, pelvic pain, vaginal bleeding or vaginal discharge.   Denies joint pain, back pain or muscle pain/cramps. Denies itching, rash, or wounds. Denies dizziness, headaches, numbness or seizures. Denies swollen lymph nodes or glands, denies easy bruising or bleeding. Denies anxiety, depression, confusion, or decreased concentration.  Physical Exam: BP (!) 122/49 (BP Location: Right Arm, Patient Position: Sitting)   Pulse 96   Temp (!) 97.5 F (36.4 C) (Oral)   Resp 16   Ht 5' (1.524 m)   Wt 107 lb 6.4 oz (48.7 kg)   LMP 03/22/2003 (Exact Date)   SpO2 100%   BMI 20.98 kg/m   General: Alert, oriented, no acute distress. HEENT: Normocephalic, atraumatic, sclera anicteric. Chest: Unlabored breathing on room air. Extremities: Grossly normal range of motion.  Warm, well perfused.  No edema bilaterally. Skin: No rashes or lesions noted. GU: Normal appearing external genitalia without erythema, excoriation, or lesions.  Speculum exam reveals moderate atrophy of the vagina, no discharge or bleeding.  Atrophy noted at the vaginal apex but no lesions or atypical vascularity noted.  Bimanual exam reveals no nodularity, cuff intact.  Vagina is foreshortened measuring approximately 4 cm.   Pap and HPV collected.  Laboratory & Radiologic Studies: None new  Assessment & Plan: Renee Watkins is a 77 y.o. woman with long history outlined above of vaginal dysplasia with recent Pap smear showing high-grade lesion, biopsy confirmed VAIN 2-3 last in 02/2021, now status post vaginal CO2 laser ablation in 04/2021.   Patient is doing well, is NED on exam today.  Continues to use vaginal estrogen.   Cotesting was performed today.  We will notify her with these results.  Plan to see her back in 6 months.  20 minutes of total time was spent for this patient encounter, including preparation, face-to-face counseling with the patient and coordination of care, and documentation of the encounter.  Jeral Pinch, MD  Division of Gynecologic Oncology  Department of Obstetrics and Gynecology  [facility]

## 2022-06-20 LAB — CYTOLOGY - PAP
Comment: NEGATIVE
Diagnosis: NEGATIVE
Diagnosis: REACTIVE
High risk HPV: NEGATIVE

## 2022-06-22 ENCOUNTER — Telehealth: Payer: Self-pay | Admitting: Surgery

## 2022-06-22 NOTE — Telephone Encounter (Signed)
Per Dr Berline Lopes, called patient's daughter Mechele Claude) to let her know that her mom's pap results were normal and HPV testing was negative. Daughter verbalized understanding and had no other concerns at this time.

## 2022-06-22 NOTE — Progress Notes (Signed)
Could one of you please call her daughter to pass along my message about her pap results? Thank you

## 2022-06-24 DIAGNOSIS — R590 Localized enlarged lymph nodes: Secondary | ICD-10-CM | POA: Diagnosis not present

## 2022-06-28 ENCOUNTER — Telehealth: Payer: Self-pay

## 2022-06-28 NOTE — Telephone Encounter (Signed)
Eliane Decree PA at [facility] called for Dr. Pricilla Holm or Warner Mccreedy NP. She states she has been following Renee Watkins for cellulitis. An ultrasound was performed in 01/2022 to rule out DVT but showed cellulitis. Now that the Cellulitis is better a recent ultrasound showed pt as having pelvic lymphadenopathy. Studies recommend getting a CT scan. Renee Watkins would like to discuss our recommendation since we have been seeing pt for awhile. CB# [phone]

## 2022-06-28 NOTE — Telephone Encounter (Signed)
I only see a LE duplex from November, which I'm assuming was at the time of her cellulitis diagnosis. I would recommend either repeating an ultrasound to assess size of lymph nodes that were previously enlarged or getting a CT to evaluate. I think this was very likely related to her acute infection at the time.  Melissa - would you mind passing this message along? Thank you

## 2022-06-29 ENCOUNTER — Other Ambulatory Visit: Payer: Self-pay | Admitting: Physician Assistant

## 2022-06-29 DIAGNOSIS — R590 Localized enlarged lymph nodes: Secondary | ICD-10-CM

## 2022-07-19 ENCOUNTER — Telehealth: Payer: Self-pay | Admitting: *Deleted

## 2022-07-19 NOTE — Telephone Encounter (Signed)
Patient's daughter called and requested a valtrex script. [facility]

## 2022-07-20 ENCOUNTER — Other Ambulatory Visit: Payer: Self-pay | Admitting: Gynecologic Oncology

## 2022-07-20 DIAGNOSIS — Z8619 Personal history of other infectious and parasitic diseases: Secondary | ICD-10-CM

## 2022-07-20 MED ORDER — VALACYCLOVIR HCL 500 MG PO TABS: 500.0000 mg | ORAL_TABLET | Freq: Two times a day (BID) | ORAL | 4 refills | Status: AC

## 2022-07-20 NOTE — Telephone Encounter (Signed)
Script sent  

## 2022-07-29 DIAGNOSIS — E559 Vitamin D deficiency, unspecified: Secondary | ICD-10-CM | POA: Diagnosis not present

## 2022-07-29 DIAGNOSIS — I10 Essential (primary) hypertension: Secondary | ICD-10-CM | POA: Diagnosis not present

## 2022-07-29 DIAGNOSIS — M810 Age-related osteoporosis without current pathological fracture: Secondary | ICD-10-CM | POA: Diagnosis not present

## 2022-08-01 ENCOUNTER — Ambulatory Visit
Admission: RE | Admit: 2022-08-01 | Discharge: 2022-08-01 | Disposition: A | Discharge status: Home / Self Care | Payer: Medicare HMO | Source: Ambulatory Visit | Attending: Physician Assistant | Admitting: Physician Assistant

## 2022-08-01 DIAGNOSIS — R2242 Localized swelling, mass and lump, left lower limb: Secondary | ICD-10-CM | POA: Diagnosis not present

## 2022-08-01 DIAGNOSIS — M810 Age-related osteoporosis without current pathological fracture: Secondary | ICD-10-CM | POA: Diagnosis not present

## 2022-08-01 DIAGNOSIS — I10 Essential (primary) hypertension: Secondary | ICD-10-CM | POA: Diagnosis not present

## 2022-08-01 DIAGNOSIS — R590 Localized enlarged lymph nodes: Secondary | ICD-10-CM | POA: Diagnosis not present

## 2022-08-01 DIAGNOSIS — E559 Vitamin D deficiency, unspecified: Secondary | ICD-10-CM | POA: Diagnosis not present

## 2022-08-01 MED ORDER — IOPAMIDOL (ISOVUE-300) INJECTION 61%
100.0000 mL | Freq: Once | INTRAVENOUS | Status: AC | PRN
  Administered 2022-08-01: 100 mL via INTRAVENOUS

## 2022-08-22 ENCOUNTER — Other Ambulatory Visit: Payer: Self-pay | Admitting: Physician Assistant

## 2022-08-22 ENCOUNTER — Ambulatory Visit
Admission: RE | Admit: 2022-08-22 | Discharge: 2022-08-22 | Disposition: A | Discharge status: Home / Self Care | Payer: Medicare HMO | Source: Ambulatory Visit | Attending: Physician Assistant | Admitting: Physician Assistant

## 2022-08-22 DIAGNOSIS — M545 Low back pain, unspecified: Secondary | ICD-10-CM | POA: Diagnosis not present

## 2022-08-22 DIAGNOSIS — G8929 Other chronic pain: Secondary | ICD-10-CM | POA: Diagnosis not present

## 2022-08-22 DIAGNOSIS — E7800 Pure hypercholesterolemia, unspecified: Secondary | ICD-10-CM | POA: Diagnosis not present

## 2022-08-22 DIAGNOSIS — M47816 Spondylosis without myelopathy or radiculopathy, lumbar region: Secondary | ICD-10-CM | POA: Diagnosis not present

## 2022-08-22 DIAGNOSIS — M810 Age-related osteoporosis without current pathological fracture: Secondary | ICD-10-CM | POA: Diagnosis not present

## 2022-11-01 ENCOUNTER — Telehealth: Payer: Self-pay

## 2022-11-01 NOTE — Telephone Encounter (Signed)
Ms.Rathbun's daughter called stating she needs to schedule a follow up appointment, for her mom,  with Dr.Tucker for September.  First available appointment was October 11 @ 4:00pm. Junious Dresser agreed to date/time

## 2022-12-12 DIAGNOSIS — I10 Essential (primary) hypertension: Secondary | ICD-10-CM | POA: Diagnosis not present

## 2022-12-20 ENCOUNTER — Telehealth: Payer: Self-pay | Admitting: *Deleted

## 2022-12-20 NOTE — Telephone Encounter (Signed)
Spoke with Ms. Carducci's daughter Bridgett Larsson who called the office stating that her mother is having vaginal redness, itching, tingling and some burning that started one day ago. Pt reports no lesions. Ms. Luan Pulling states the last time they spoke with Dr. Pricilla Holm, if patient has any symptoms that she would call in Rx for Valtrex. Ms. Luan Pulling advised that her message would be relayed to Dr. Pricilla Holm. Also reminded her of Ms. Hertzog's follow up appt. With Dr. Pricilla Holm on Friday, October 11 th at 4pm.

## 2022-12-21 ENCOUNTER — Other Ambulatory Visit: Payer: Self-pay | Admitting: Gynecologic Oncology

## 2022-12-21 DIAGNOSIS — Z8619 Personal history of other infectious and parasitic diseases: Secondary | ICD-10-CM

## 2022-12-21 MED ORDER — VALACYCLOVIR HCL 500 MG PO TABS: 500.0000 mg | ORAL_TABLET | Freq: Two times a day (BID) | ORAL | 4 refills | Status: AC

## 2022-12-21 NOTE — Telephone Encounter (Signed)
Spoke with Patient's daughter Randa Evens and relayed message from Dr. Pricilla Holm to call the office after several days on medication if no improvement in symptoms. Randa Evens verbalized understanding and thanked the office for calling.

## 2022-12-21 NOTE — Telephone Encounter (Signed)
Script sent - since no lesions, please ask daughter to call if no improvement in symptoms after several days of starting medication.

## 2022-12-23 ENCOUNTER — Telehealth: Payer: Self-pay | Admitting: *Deleted

## 2022-12-23 NOTE — Telephone Encounter (Signed)
Spoke with the patient and moved appt on 10/11 to earlier in the day

## 2022-12-30 ENCOUNTER — Encounter: Payer: Self-pay | Admitting: Gynecologic Oncology

## 2022-12-30 ENCOUNTER — Inpatient Hospital Stay: Payer: Medicare HMO | Attending: Gynecologic Oncology | Admitting: Gynecologic Oncology

## 2022-12-30 VITALS — BP 144/63 | HR 90 | Temp 98.1°F | Resp 15 | Wt 111.2 lb

## 2022-12-30 DIAGNOSIS — Z79890 Hormone replacement therapy (postmenopausal): Secondary | ICD-10-CM | POA: Insufficient documentation

## 2022-12-30 DIAGNOSIS — A609 Anogenital herpesviral infection, unspecified: Secondary | ICD-10-CM | POA: Insufficient documentation

## 2022-12-30 DIAGNOSIS — Z9079 Acquired absence of other genital organ(s): Secondary | ICD-10-CM | POA: Insufficient documentation

## 2022-12-30 DIAGNOSIS — D072 Carcinoma in situ of vagina: Secondary | ICD-10-CM | POA: Diagnosis not present

## 2022-12-30 DIAGNOSIS — Z90710 Acquired absence of both cervix and uterus: Secondary | ICD-10-CM | POA: Insufficient documentation

## 2022-12-30 NOTE — Progress Notes (Signed)
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   Treatment History: The patient has a long standing history of abnormal paps and high risk HPV.   She had a pap in August 2016 which showed ASCUS with high risk HPV positive. Cytology was negaive in 2017 but high risk HPV remained present.  On 11/08/16 she underwent a pap which showed ASC-H with positive high risk HPV. Colposcopy of the vagina on 11/23/16 showed an agglutinated upper vagina and the cervix could not be visualized. However, there was an area of increased pigmentation of the vagina just under the fusion at 6 o'clock and a biopsy was taken. There was decreased lugols staining throughout the vagina and a biopsy was taken at 12 o'clock.   These biopsies revealed "CIN 2-3".   A TVUS was obtained on 12/20/16 to identify the cervix as none could be seen on exam. The US showed a 6.8x3.6x3.2cm uterus with a thin endometrium. The cervix was grossly normal on [place].    She was treated with 5-FU vaginally for 2 months in December and January, 2019. She then underwent repeat pap on 06/15/17 which showed VAIN/CIN 2-3 with high risk HPV detected.    She underwent robotic assisted modified radical (Type II) hysterectomy and upper vaginectomy on 08/15/17. Final pathology showed a focal high grade squamous intrapeithelial lesions (CIN III) in the cervix, but negative vaginal margins. The ovaries and tubes were benign.   [name] had follow-up with Dr Oscar La, initially having normal paps after her procedure.   Vaginal pap on 11/14/19 showed HGSIL.  Vaginal biopsy on 11/27/19 showed atrophic squamous mucosa.  No interventions took place after these results.    Seen in 07/2020 -normal colposcopic exam.  No lesion amenable to resection or laser.  Plan was to treat with 6 months of vaginal estrogen and repeat Pap in November.  If high-grade dysplasia persisted, consideration for either 5-FU or  laser of the entire vaginal mucosa was discussed.   01/20/21: Vaginal pap showed HSIL   02/10/21: Vaginoscopy performed with no significant decreased uptake after application of Lugol's.  Random biopsy taken from the midportion of the cuff.  Biopsy showed the VAIN 2-3.   04/27/2021: Vaginal biopsies, CO2 laser of the vagina.  Findings were notable for shortened vaginal canal, significant atrophy and scarring at vaginal apex.  Minimal diffuse decreased uptake noted after application of Lugol's.  No visible lesions.  Interval History: Doing well.  Continues to use vaginal estrogen 3 times a week.  Denies any bleeding, vulvar/vaginal pain, pruritus, or other symptoms.  Recently took Valtrex for symptoms concerning for herpes, has had almost complete resolution of her symptoms.  Past Medical/Surgical History: Past Medical History:  Diagnosis Date   Abnormal Pap smear of cervix 09/30/2013   neg pap HPV HR +, 8-10-16ASCUS HPV HR+, 8/17 neg HPV HR+   Bilateral artificial lens implant    Cellulitis and abscess of left leg 01/2022   Hypertension    Osteoporosis    STD (sexually transmitted disease)    HSV   Vaginal intraepithelial neoplasia III (VAIN III)     Past Surgical History:  Procedure Laterality Date   Base Wedge Osteomy Right 03/10/10   right foot   BUNIONECTOMY Right 03/08/12   Keller right foot   Capsulotomy IPJ Right 03/08/12   Right foot 2   CATARACT EXTRACTION     both eyes   CO2 LASER APPLICATION N/A 04/27/2021  Procedure: CO2 LASER APPLICATION TO VAGINA, VAGINAL BIOPSIES;  Surgeon: Carver Fila, MD;  Location: [facility];  Service: Gynecology;  Laterality: N/A;   COLONOSCOPY WITH PROPOFOL N/A 07/08/2014   Procedure: COLONOSCOPY WITH PROPOFOL;  Surgeon: Charolett Bumpers, MD;  Location: [facility];  Service: Endoscopy;  Laterality: N/A;   COLPOSCOPY     2015, ECC neg, polyp neg, 2016 & 2017   Cotton Ost w/graft Right 03/10/10   right foot   FOOT  SURGERY Right 2000, 4098,1191   FOOT SURGERY Left 2002, 2006, 2010   Hammer toe repair Right 03/10/10   right foot # 5   Hammer toe repair Left 03/02/11   left foot 3 . 5   MANDIBLE FRACTURE SURGERY     ROBOTIC ASSISTED TOTAL HYSTERECTOMY N/A 08/15/2017   Procedure: XI ROBOTIC ASSISTED MODIFIED RADICAL TOTAL HYSTERECTOMY WITH UPPER VAGINECTOMY;  Surgeon: Adolphus Birchwood, MD;  Location: [facility];  Service: Gynecology;  Laterality: N/A;   TUBAL LIGATION      Family History  Problem Relation Age of Onset   Hypertension Mother    Hypertension Sister    Leukemia Sister    Breast cancer Sister    Hypertension Brother    Colon cancer Neg Hx    Ovarian cancer Neg Hx    Endometrial cancer Neg Hx    Pancreatic cancer Neg Hx    Prostate cancer Neg Hx     Social History   Socioeconomic History   Marital status: Divorced    Spouse name: Not on file   Number of children: Not on file   Years of education: Not on file   Highest education level: Not on file  Occupational History   Not on file  Tobacco Use   Smoking status: Never   Smokeless tobacco: Never  Vaping Use   Vaping status: Never Used  Substance and Sexual Activity   Alcohol use: No   Drug use: No   Sexual activity: Not Currently    Birth control/protection: Surgical    Comment: BTL, hysterectomy  Other Topics Concern   Not on file  Social History Narrative   Not on file   Social Determinants of Health   Financial Resource Strain: Not on file  Food Insecurity: Not on file  Transportation Needs: Not on file  Physical Activity: Not on file  Stress: Not on file  Social Connections: Not on file    Current Medications:  Current Outpatient Medications:    acetaminophen (TYLENOL) 500 MG tablet, Take 500 mg by mouth every 6 (six) hours as needed., Disp: , Rfl:    estradiol (ESTRACE) 0.1 MG/GM vaginal cream, PLACE 1 APPLICATORFUL VAGINALLY 3 (THREE) TIMES A WEEK., Disp: 42.5 g, Rfl: 12   FORTEO 600 MCG/2.4ML SOPN, Inject  into the skin., Disp: , Rfl:    furosemide (LASIX) 40 MG tablet, Take 40 mg by mouth daily., Disp: , Rfl:    KLOR-CON M10 10 MEQ tablet, Take 10 mEq by mouth daily., Disp: , Rfl:    lisinopril (PRINIVIL,ZESTRIL) 10 MG tablet, Take 10 mg by mouth every morning. , Disp: , Rfl:    Multiple Vitamins-Minerals (OCUVITE PO), Take by mouth daily., Disp: , Rfl:    simvastatin (ZOCOR) 20 MG tablet, 1 tablet in the evening Orally Once a day for 90 days, Disp: , Rfl:    valACYclovir (VALTREX) 500 MG tablet, Take 1 tablet (500 mg total) by mouth 2 (two) times daily., Disp: 18 tablet, Rfl: 4  Review  of Systems: Denies appetite changes, fevers, chills, fatigue, unexplained weight changes. Denies hearing loss, neck lumps or masses, mouth sores, ringing in ears or voice changes. Denies cough or wheezing.  Denies shortness of breath. Denies chest pain or palpitations. Denies leg swelling. Denies abdominal distention, pain, blood in stools, constipation, diarrhea, nausea, vomiting, or early satiety. Denies pain with intercourse, dysuria, frequency, hematuria or incontinence. Denies hot flashes, pelvic pain, vaginal bleeding or vaginal discharge.   Denies joint pain, back pain or muscle pain/cramps. Denies itching, rash, or wounds. Denies dizziness, headaches, numbness or seizures. Denies swollen lymph nodes or glands, denies easy bruising or bleeding. Denies anxiety, depression, confusion, or decreased concentration.  Physical Exam: BP (!) 144/63 (BP Location: Right Arm, Patient Position: Sitting)   Pulse 90   Temp 98.1 F (36.7 C) (Oral)   Resp 15   Wt 111 lb 3.2 oz (50.4 kg)   LMP 03/22/2003 (Exact Date)   SpO2 100%   BMI 21.72 kg/m  General: Alert, oriented, no acute distress. HEENT: Normocephalic, atraumatic, sclera anicteric. Chest: Unlabored breathing on room air. Extremities: Grossly normal range of motion.  Warm, well perfused.  No edema bilaterally. Skin: No rashes or lesions noted. GU:  Normal appearing external genitalia without erythema, excoriation, or lesions.  Healing area that is mildly hyperpigmented at the upper aspect of the labia majora, I suspect resolving herpes lesion.  Speculum exam reveals moderate atrophy of the vagina, no discharge or bleeding.  Atrophy noted at the vaginal apex but no lesions or atypical vascularity noted.  Bimanual exam reveals no nodularity, cuff intact.  Vagina is foreshortened measuring approximately 4 cm.   5% acetic acid placed on the vaginal tissue with no acetowhite changes noted.  Laboratory & Radiologic Studies: 05/2022: NIML pap, HR HPV negative  Assessment & Plan: Renee Watkins is a 77 y.o. woman with long history outlined above of vaginal dysplasia with recent Pap smear showing high-grade lesion, biopsy confirmed VAIN 2-3 last in 02/2021, now status post vaginal CO2 laser ablation in 04/2021.   Patient is doing well, is NED on exam today.  Continues to use vaginal estrogen.   Plan for cotesting at her next visit.   Plan to see her back in 6 months.  20 minutes of total time was spent for this patient encounter, including preparation, face-to-face counseling with the patient and coordination of care, and documentation of the encounter.  Eugene Garnet, MD  Division of Gynecologic Oncology  Department of Obstetrics and Gynecology  [facility]

## 2022-12-30 NOTE — Patient Instructions (Signed)
It was good to see you today.  Your exam looked very reassuring today.  I will see you for follow-up in 6 months.  As always, if you develop any new and concerning symptoms before your next visit, please call to see me sooner.

## 2023-04-03 ENCOUNTER — Telehealth: Payer: Self-pay

## 2023-04-03 NOTE — Telephone Encounter (Signed)
 Renee Watkins states she is not sure when the last time her mom has picked up the medication from her pharmacy. While on phone with me, she did look at the pharmacy history and pt has not had to refill since 12/30/22. [name] will call pharmacy and get them to refill it. She was thankful for the call and for clarification.

## 2023-04-03 NOTE — Telephone Encounter (Signed)
 Ms.Boddy's daughter, [name], called the office on behalf of her mom requesting a refill of the Valtrex  500mg  be sent to preferred pharmacy on file.   Ms.Fornes has a follow up appointment scheduled with Dr.Tucker on 06/30/2023.  Message sent to Eleanor Epps NP

## 2023-06-30 ENCOUNTER — Encounter: Payer: Self-pay | Admitting: Gynecologic Oncology

## 2023-06-30 ENCOUNTER — Inpatient Hospital Stay: Payer: Medicare HMO | Attending: Gynecologic Oncology | Admitting: Gynecologic Oncology

## 2023-06-30 VITALS — BP 138/68 | HR 83 | Temp 98.2°F | Resp 18 | Wt 112.0 lb

## 2023-06-30 DIAGNOSIS — D072 Carcinoma in situ of vagina: Secondary | ICD-10-CM | POA: Diagnosis not present

## 2023-06-30 DIAGNOSIS — Z1151 Encounter for screening for human papillomavirus (HPV): Secondary | ICD-10-CM | POA: Diagnosis not present

## 2023-06-30 DIAGNOSIS — Z124 Encounter for screening for malignant neoplasm of cervix: Secondary | ICD-10-CM | POA: Diagnosis not present

## 2023-06-30 DIAGNOSIS — Z90710 Acquired absence of both cervix and uterus: Secondary | ICD-10-CM | POA: Insufficient documentation

## 2023-06-30 DIAGNOSIS — Z9079 Acquired absence of other genital organ(s): Secondary | ICD-10-CM | POA: Diagnosis not present

## 2023-06-30 DIAGNOSIS — Z79890 Hormone replacement therapy (postmenopausal): Secondary | ICD-10-CM | POA: Insufficient documentation

## 2023-06-30 DIAGNOSIS — N952 Postmenopausal atrophic vaginitis: Secondary | ICD-10-CM | POA: Insufficient documentation

## 2023-06-30 NOTE — Patient Instructions (Signed)
 It was good to see you today.   We will let you know when your Pap and HPV testing are back next week.  I will see you for follow-up in 6 months.  As always, if you develop any new and concerning symptoms before your next visit, please call to see me sooner.

## 2023-06-30 NOTE — Progress Notes (Signed)
 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   Treatment History: The patient has a long standing history of abnormal paps and high risk HPV.   She had a pap in August 2016 which showed ASCUS with high risk HPV positive. Cytology was negaive in 2017 but high risk HPV remained present.  On 11/08/16 she underwent a pap which showed ASC-H with positive high risk HPV. Colposcopy of the vagina on 11/23/16 showed an agglutinated upper vagina and the cervix could not be visualized. However, there was an area of increased pigmentation of the vagina just under the fusion at 6 o'clock and a biopsy was taken. There was decreased lugols staining throughout the vagina and a biopsy was taken at 12 o'clock.   These biopsies revealed "CIN 2-3".   A TVUS was obtained on 12/20/16 to identify the cervix as none could be seen on exam. The US showed a 6.8x3.6x3.2cm uterus with a thin endometrium. The cervix was grossly normal on [place].    She was treated with 5-FU vaginally for 2 months in December and January, 2019. She then underwent repeat pap on 06/15/17 which showed VAIN/CIN 2-3 with high risk HPV detected.    She underwent robotic assisted modified radical (Type II) hysterectomy and upper vaginectomy on 08/15/17. Final pathology showed a focal high grade squamous intrapeithelial lesions (CIN III) in the cervix, but negative vaginal margins. The ovaries and tubes were benign.   Renee Watkins had follow-up with Dr Oscar La, initially having normal paps after her procedure.   Vaginal pap on 11/14/19 showed HGSIL.  Vaginal biopsy on 11/27/19 showed atrophic squamous mucosa.  No interventions took place after these results.    Seen in 07/2020 -normal colposcopic exam.  No lesion amenable to resection or laser.  Plan was to treat with 6 months of vaginal estrogen and repeat Pap in November.  If high-grade dysplasia persisted, consideration for either 5-FU or  laser of the entire vaginal mucosa was discussed.   01/20/21: Vaginal pap showed HSIL   02/10/21: Vaginoscopy performed with no significant decreased uptake after application of Lugol's.  Random biopsy taken from the midportion of the cuff.  Biopsy showed the VAIN 2-3.   04/27/2021: Vaginal biopsies, CO2 laser of the vagina.  Findings were notable for shortened vaginal canal, significant atrophy and scarring at vaginal apex.  Minimal diffuse decreased uptake noted after application of Lugol's.  No visible lesions.  05/2022: Pap NIML, HR HPV negative.  Interval History: Doing well.  Continues to use vaginal estrogen regularly, 2-3 times a week.  Denies any vaginal bleeding or discharge.  Reports baseline bowel bladder function.  Past Medical/Surgical History: Past Medical History:  Diagnosis Date   Abnormal Pap smear of cervix 09/30/2013   neg pap HPV HR +, 8-10-16ASCUS HPV HR+, 8/17 neg HPV HR+   Bilateral artificial lens implant    Cellulitis and abscess of left leg 01/2022   Hypertension    Osteoporosis    STD (sexually transmitted disease)    HSV   Vaginal intraepithelial neoplasia III (VAIN III)     Past Surgical History:  Procedure Laterality Date   Base Wedge Osteomy Right 03/10/10   right foot   BUNIONECTOMY Right 03/08/12   Keller right foot   Capsulotomy IPJ Right 03/08/12   Right foot 2   CATARACT EXTRACTION     both eyes   CO2 LASER APPLICATION N/A 04/27/2021   Procedure: CO2 LASER APPLICATION TO VAGINA,  VAGINAL BIOPSIES;  Surgeon: Carver Fila, MD;  Location: [facility];  Service: Gynecology;  Laterality: N/A;   COLONOSCOPY WITH PROPOFOL N/A 07/08/2014   Procedure: COLONOSCOPY WITH PROPOFOL;  Surgeon: Charolett Bumpers, MD;  Location: [facility];  Service: Endoscopy;  Laterality: N/A;   COLPOSCOPY     2015, ECC neg, polyp neg, 2016 & 2017   Cotton Ost w/graft Right 03/10/10   right foot   FOOT SURGERY Right 2000, [id number]   FOOT SURGERY Left  2002, 2006, 2010   Hammer toe repair Right 03/10/10   right foot # 5   Hammer toe repair Left 03/02/11   left foot 3 . 5   MANDIBLE FRACTURE SURGERY     ROBOTIC ASSISTED TOTAL HYSTERECTOMY N/A 08/15/2017   Procedure: XI ROBOTIC ASSISTED MODIFIED RADICAL TOTAL HYSTERECTOMY WITH UPPER VAGINECTOMY;  Surgeon: Adolphus Birchwood, MD;  Location: [facility];  Service: Gynecology;  Laterality: N/A;   TUBAL LIGATION      Family History  Problem Relation Age of Onset   Hypertension Mother    Hypertension Sister    Leukemia Sister    Breast cancer Sister    Hypertension Brother    Colon cancer Neg Hx    Ovarian cancer Neg Hx    Endometrial cancer Neg Hx    Pancreatic cancer Neg Hx    Prostate cancer Neg Hx     Social History   Socioeconomic History   Marital status: Divorced    Spouse name: Not on file   Number of children: Not on file   Years of education: Not on file   Highest education level: Not on file  Occupational History   Not on file  Tobacco Use   Smoking status: Never   Smokeless tobacco: Never  Vaping Use   Vaping status: Never Used  Substance and Sexual Activity   Alcohol use: No   Drug use: No   Sexual activity: Not Currently    Birth control/protection: Surgical    Comment: BTL, hysterectomy  Other Topics Concern   Not on file  Social History Narrative   Not on file   Social Drivers of Health   Financial Resource Strain: Not on file  Food Insecurity: Not on file  Transportation Needs: Not on file  Physical Activity: Not on file  Stress: Not on file  Social Connections: Not on file    Current Medications:  Current Outpatient Medications:    acetaminophen (TYLENOL) 500 MG tablet, Take 500 mg by mouth every 6 (six) hours as needed., Disp: , Rfl:    estradiol (ESTRACE) 0.1 MG/GM vaginal cream, PLACE 1 APPLICATORFUL VAGINALLY 3 (THREE) TIMES A WEEK., Disp: 42.5 g, Rfl: 12   FORTEO 600 MCG/2.4ML SOPN, Inject into the skin., Disp: , Rfl:    furosemide (LASIX) 40 MG  tablet, Take 40 mg by mouth daily., Disp: , Rfl:    KLOR-CON M10 10 MEQ tablet, Take 10 mEq by mouth daily., Disp: , Rfl:    lisinopril (PRINIVIL,ZESTRIL) 10 MG tablet, Take 10 mg by mouth every morning. , Disp: , Rfl:    Multiple Vitamins-Minerals (OCUVITE PO), Take by mouth daily., Disp: , Rfl:    simvastatin (ZOCOR) 20 MG tablet, 1 tablet in the evening Orally Once a day for 90 days, Disp: , Rfl:    valACYclovir (VALTREX) 500 MG tablet, Take 1 tablet (500 mg total) by mouth 2 (two) times daily., Disp: 18 tablet, Rfl: 4  Review of Systems: + Intermittent back pain  Denies appetite changes, fevers, chills, fatigue, unexplained weight changes. Denies hearing loss, neck lumps or masses, mouth sores, ringing in ears or voice changes. Denies cough or wheezing.  Denies shortness of breath. Denies chest pain or palpitations. Denies leg swelling. Denies abdominal distention, pain, blood in stools, constipation, diarrhea, nausea, vomiting, or early satiety. Denies pain with intercourse, dysuria, frequency, hematuria or incontinence. Denies hot flashes, pelvic pain, vaginal bleeding or vaginal discharge.   Denies joint pain or muscle pain/cramps. Denies itching, rash, or wounds. Denies dizziness, headaches, numbness or seizures. Denies swollen lymph nodes or glands, denies easy bruising or bleeding. Denies anxiety, depression, confusion, or decreased concentration.  Physical Exam: BP 138/68 (BP Location: Left Arm, Patient Position: Sitting)   Pulse 83   Temp 98.2 F (36.8 C) (Oral)   Resp 18   Wt 112 lb (50.8 kg)   LMP 03/22/2003 (Exact Date)   SpO2 100%   BMI 21.87 kg/m  General: Alert, oriented, no acute distress. HEENT: Normocephalic, atraumatic, sclera anicteric. Chest: Unlabored breathing on room air. Extremities: Grossly normal range of motion.  Warm, well perfused.  No edema bilaterally. Skin: No rashes or lesions noted. GU: Normal-appearing external genitalia without erythema,  excoriation, or lesions.  On speculum exam, vagina is foreshortened, approximately 4 cm, narrow, and moderately atrophic.  Scarring noted at the apex.  No visible lesions noted.  Pap and HPV collected.  On bimanual exam, no nodularity or masses appreciated at the cuff.  Laboratory & Radiologic Studies: None new  Assessment & Plan: Renee Watkins is a 78 y.o. woman with long history outlined above of vaginal dysplasia with recent Pap smear showing high-grade lesion, biopsy confirmed VAIN 2-3 last in 02/2021, now status post vaginal CO2 laser ablation in 04/2021. 05/2022: Pap NIML, HR HPV negative.   Patient is doing well, is NED on exam today.  Continues to use vaginal estrogen.   Pap/HPV performed today. Will notify her with these results.   Plan to see her back in 6 months.  20 minutes of total time was spent for this patient encounter, including preparation, face-to-face counseling with the patient and coordination of care, and documentation of the encounter.  Eugene Garnet, MD  Division of Gynecologic Oncology  Department of Obstetrics and Gynecology  [facility]

## 2023-07-05 ENCOUNTER — Other Ambulatory Visit: Payer: Self-pay | Admitting: Gynecologic Oncology

## 2023-07-07 ENCOUNTER — Telehealth: Payer: Self-pay | Admitting: Surgery

## 2023-07-07 LAB — CYTOLOGY - PAP
Comment: NEGATIVE
Diagnosis: UNDETERMINED — AB
High risk HPV: POSITIVE — AB

## 2023-07-07 NOTE — Telephone Encounter (Signed)
-----   Message from Comer JONELLE Dollar sent at 07/07/2023  3:42 PM EDT ----- Could you please call and let her know (probably best to speak with her daughter) that pap test was minimally abnormal but HPV was positive? I'd like to get her scheduled in the next 6 weeks for colposcopy. Thank you

## 2023-07-07 NOTE — Telephone Encounter (Signed)
 Reviewed results with patient's daughter, Mia Adam, and scheduled follow up for colposcopy for 08/04/23.

## 2023-08-02 ENCOUNTER — Telehealth: Payer: Self-pay

## 2023-08-02 NOTE — Telephone Encounter (Signed)
 Per provider moved appointment from 4:15pm up to 2:00pm.. Called patient daughter and confirmed with her.

## 2023-08-04 ENCOUNTER — Inpatient Hospital Stay: Attending: Gynecologic Oncology | Admitting: Gynecologic Oncology

## 2023-08-04 ENCOUNTER — Encounter: Payer: Self-pay | Admitting: Gynecologic Oncology

## 2023-08-04 VITALS — BP 139/51 | HR 93 | Temp 97.7°F | Resp 18 | Ht 62.68 in | Wt 112.8 lb

## 2023-08-04 DIAGNOSIS — Z9221 Personal history of antineoplastic chemotherapy: Secondary | ICD-10-CM | POA: Insufficient documentation

## 2023-08-04 DIAGNOSIS — D072 Carcinoma in situ of vagina: Secondary | ICD-10-CM | POA: Diagnosis present

## 2023-08-04 DIAGNOSIS — N952 Postmenopausal atrophic vaginitis: Secondary | ICD-10-CM | POA: Diagnosis not present

## 2023-08-04 DIAGNOSIS — Z90722 Acquired absence of ovaries, bilateral: Secondary | ICD-10-CM | POA: Insufficient documentation

## 2023-08-04 DIAGNOSIS — Z90710 Acquired absence of both cervix and uterus: Secondary | ICD-10-CM | POA: Insufficient documentation

## 2023-08-04 DIAGNOSIS — Z8619 Personal history of other infectious and parasitic diseases: Secondary | ICD-10-CM | POA: Diagnosis not present

## 2023-08-04 DIAGNOSIS — Z9889 Other specified postprocedural states: Secondary | ICD-10-CM | POA: Diagnosis not present

## 2023-08-04 DIAGNOSIS — Z9079 Acquired absence of other genital organ(s): Secondary | ICD-10-CM | POA: Insufficient documentation

## 2023-08-04 NOTE — Progress Notes (Addendum)
 Gynecologic Oncology Return Clinic Visit  08/04/23  Reason for Visit: Colposcopy   Treatment History: The patient has a long standing history of abnormal paps and high risk HPV.   She had a pap in August 2016 which showed ASCUS with high risk HPV positive. Cytology was negaive in 2017 but high risk HPV remained present.  On 11/08/16 she underwent a pap which showed ASC-H with positive high risk HPV. Colposcopy of the vagina on 11/23/16 showed an agglutinated upper vagina and the cervix could not be visualized. However, there was an area of increased pigmentation of the vagina just under the fusion at 6 o'clock and a biopsy was taken. There was decreased lugols staining throughout the vagina and a biopsy was taken at 12 o'clock.   These biopsies revealed CIN 2-3.   A TVUS was obtained on 12/20/16 to identify the cervix as none could be seen on exam. The US  showed a 6.8x3.6x3.2cm uterus with a thin endometrium. The cervix was grossly normal on US .    She was treated with 5-FU vaginally for 2 months in December and January, 2019. She then underwent repeat pap on 06/15/17 which showed VAIN/CIN 2-3 with high risk HPV detected.    She underwent robotic assisted modified radical (Type II) hysterectomy and upper vaginectomy on 08/15/17. Final pathology showed a focal high grade squamous intrapeithelial lesions (CIN III) in the cervix, but negative vaginal margins. The ovaries and tubes were benign.   Renee Watkins had follow-up with Dr Jannis, initially having normal paps after her procedure.   Vaginal pap on 11/14/19 showed HGSIL.  Vaginal biopsy on 11/27/19 showed atrophic squamous mucosa.  No interventions took place after these results.    Seen in 07/2020 -normal colposcopic exam.  No lesion amenable to resection or laser.  Plan was to treat with 6 months of vaginal estrogen and repeat Pap in November.  If high-grade dysplasia persisted, consideration for either 5-FU or laser of the entire vaginal mucosa  was discussed.   01/20/21: Vaginal pap showed HSIL   02/10/21: Vaginoscopy performed with no significant decreased uptake after application of Lugol's.  Random biopsy taken from the midportion of the cuff.  Biopsy showed the VAIN 2-3.   04/27/2021: Vaginal biopsies, CO2 laser of the vagina.  Findings were notable for shortened vaginal canal, significant atrophy and scarring at vaginal apex.  Minimal diffuse decreased uptake noted after application of Lugol's.  No visible lesions.   05/2022: Pap NIML, HR HPV negative.  06/30/23: ASCUS pap, HR HPV +.  Interval History: Doing well.  No significant changes since her last visit.  Past Medical/Surgical History: Past Medical History:  Diagnosis Date   Abnormal Pap smear of cervix 09/30/2013   neg pap HPV HR +, 8-10-16ASCUS HPV HR+, 8/17 neg HPV HR+   Bilateral artificial lens implant    Cellulitis and abscess of left leg 01/2022   Hypertension    Osteoporosis    STD (sexually transmitted disease)    HSV   Vaginal intraepithelial neoplasia III (VAIN III)     Past Surgical History:  Procedure Laterality Date   Base Wedge Osteomy Right 03/10/10   right foot   BUNIONECTOMY Right 03/08/12   Keller right foot   Capsulotomy IPJ Right 03/08/12   Right foot 2   CATARACT EXTRACTION     both eyes   CO2 LASER APPLICATION N/A 04/27/2021   Procedure: CO2 LASER APPLICATION TO VAGINA, VAGINAL BIOPSIES;  Surgeon: Viktoria Comer SAUNDERS, MD;  Location: [facility];  Service: Gynecology;  Laterality: N/A;   COLONOSCOPY WITH PROPOFOL  N/A 07/08/2014   Procedure: COLONOSCOPY WITH PROPOFOL ;  Surgeon: Gladis MARLA Louder, MD;  Location: [facility];  Service: Endoscopy;  Laterality: N/A;   COLPOSCOPY     2015, ECC neg, polyp neg, 2016 & 2017   Cotton Ost w/graft Right 03/10/10   right foot   FOOT SURGERY Right 2000, [id number]   FOOT SURGERY Left 2002, 2006, 2010   Hammer toe repair Right 03/10/10   right foot # 5   Hammer toe repair Left  03/02/11   left foot 3 . 5   MANDIBLE FRACTURE SURGERY     ROBOTIC ASSISTED TOTAL HYSTERECTOMY N/A 08/15/2017   Procedure: XI ROBOTIC ASSISTED MODIFIED RADICAL TOTAL HYSTERECTOMY WITH UPPER VAGINECTOMY;  Surgeon: Eloy Herring, MD;  Location: [facility];  Service: Gynecology;  Laterality: N/A;   TUBAL LIGATION      Family History  Problem Relation Age of Onset   Hypertension Mother    Hypertension Sister    Leukemia Sister    Breast cancer Sister    Hypertension Brother    Colon cancer Neg Hx    Ovarian cancer Neg Hx    Endometrial cancer Neg Hx    Pancreatic cancer Neg Hx    Prostate cancer Neg Hx     Social History   Socioeconomic History   Marital status: Divorced    Spouse name: Not on file   Number of children: Not on file   Years of education: Not on file   Highest education level: Not on file  Occupational History   Not on file  Tobacco Use   Smoking status: Never   Smokeless tobacco: Never  Vaping Use   Vaping status: Never Used  Substance and Sexual Activity   Alcohol use: No   Drug use: No   Sexual activity: Not Currently    Birth control/protection: Surgical    Comment: BTL, hysterectomy  Other Topics Concern   Not on file  Social History Narrative   Not on file   Social Drivers of Health   Financial Resource Strain: Not on file  Food Insecurity: Not on file  Transportation Needs: Not on file  Physical Activity: Not on file  Stress: Not on file  Social Connections: Not on file    Current Medications:  Current Outpatient Medications:    acetaminophen  (TYLENOL ) 500 MG tablet, Take 500 mg by mouth every 6 (six) hours as needed., Disp: , Rfl:    estradiol  (ESTRACE ) 0.1 MG/GM vaginal cream, PLACE 1 APPLICATORFUL VAGINALLY 3 (THREE) TIMES A WEEK., Disp: 42.5 g, Rfl: 12   FORTEO 600 MCG/2.4ML SOPN, Inject into the skin., Disp: , Rfl:    furosemide (LASIX) 40 MG tablet, Take 40 mg by mouth daily., Disp: , Rfl:    KLOR-CON  M10 10 MEQ tablet, Take 10 mEq by  mouth daily., Disp: , Rfl:    lisinopril  (PRINIVIL ,ZESTRIL ) 10 MG tablet, Take 10 mg by mouth every morning. , Disp: , Rfl:    Multiple Vitamins-Minerals (OCUVITE PO), Take by mouth daily., Disp: , Rfl:    simvastatin (ZOCOR) 20 MG tablet, 1 tablet in the evening Orally Once a day for 90 days, Disp: , Rfl:    valACYclovir  (VALTREX ) 500 MG tablet, Take 1 tablet (500 mg total) by mouth 2 (two) times daily., Disp: 18 tablet, Rfl: 4  Review of Systems: Denies appetite changes, fevers, chills, fatigue, unexplained weight changes. Denies hearing loss, neck lumps or masses, mouth sores, ringing  in ears or voice changes. Denies cough or wheezing.  Denies shortness of breath. Denies chest pain or palpitations. Denies leg swelling. Denies abdominal distention, pain, blood in stools, constipation, diarrhea, nausea, vomiting, or early satiety. Denies pain with intercourse, dysuria, frequency, hematuria or incontinence. Denies hot flashes, pelvic pain, vaginal bleeding or vaginal discharge.   Denies joint pain, back pain or muscle pain/cramps. Denies itching, rash, or wounds. Denies dizziness, headaches, numbness or seizures. Denies swollen lymph nodes or glands, denies easy bruising or bleeding. Denies anxiety, depression, confusion, or decreased concentration.  Physical Exam: BP (!) 139/51 (BP Location: Left Arm, Patient Position: Sitting)   Pulse 93   Temp 97.7 F (36.5 C) (Oral)   Resp 18   Ht 5' 2.68 (1.592 m)   Wt 112 lb 12.8 oz (51.2 kg)   LMP 03/22/2003 (Exact Date)   SpO2 99%   BMI 20.19 kg/m  General: Alert, oriented, no acute distress.  GU: Normal appearing external genitalia without erythema, excoriation, or lesions.  5% acetic acid was applied in the vagina.  Colposcopy was then performed with no areas of acetowhite, punctations, or mosaicism noted.  Laboratory & Radiologic Studies: None new  Assessment & Plan: Renee Watkins is a 78 y.o. woman with long history outlined above  of vaginal dysplasia with recent Pap smear showing high-grade lesion, biopsy confirmed VAIN 2-3 last in 02/2021, now status post vaginal CO2 laser ablation in 04/2021. 06/2023: Pap ASCUS, HR HPV+.  Given recent Pap, colposcopy was performed today.  No biopsies taken. Patient's daughter asked about HSV treatment.  The patient had previously been on suppression with acyclovir and then transition to valacyclovir  just as needed for flares.  Her mother recently asked for the medication to be refilled and she thinks that her mother may be having more frequent flares.  She will talk to her mother and her sister and will let me know if she would like a new prescription for maintenance dosing of valacyclovir  sent to the pharmacy.  20 minutes of total time was spent for this patient encounter, including preparation, face-to-face counseling with the patient and coordination of care, and documentation of the encounter.  Comer Dollar, MD  Division of Gynecologic Oncology  Department of Obstetrics and Gynecology  [facility]  [facility]ospitals

## 2023-08-04 NOTE — Patient Instructions (Signed)
 It was good to see you today.  I did not see any areas that required a biopsy today

## 2024-01-03 ENCOUNTER — Encounter: Payer: Self-pay | Admitting: Gynecologic Oncology

## 2024-01-05 ENCOUNTER — Inpatient Hospital Stay: Attending: Gynecologic Oncology | Admitting: Gynecologic Oncology

## 2024-01-05 ENCOUNTER — Encounter: Payer: Self-pay | Admitting: Gynecologic Oncology

## 2024-01-05 VITALS — BP 131/60 | Temp 97.7°F | Wt 110.4 lb

## 2024-01-05 DIAGNOSIS — Z9889 Other specified postprocedural states: Secondary | ICD-10-CM | POA: Insufficient documentation

## 2024-01-05 DIAGNOSIS — Z9079 Acquired absence of other genital organ(s): Secondary | ICD-10-CM | POA: Insufficient documentation

## 2024-01-05 DIAGNOSIS — D072 Carcinoma in situ of vagina: Secondary | ICD-10-CM

## 2024-01-05 DIAGNOSIS — Z79890 Hormone replacement therapy (postmenopausal): Secondary | ICD-10-CM | POA: Diagnosis not present

## 2024-01-05 DIAGNOSIS — Z90722 Acquired absence of ovaries, bilateral: Secondary | ICD-10-CM | POA: Insufficient documentation

## 2024-01-05 DIAGNOSIS — Z86002 Personal history of in-situ neoplasm of other and unspecified genital organs: Secondary | ICD-10-CM | POA: Insufficient documentation

## 2024-01-05 DIAGNOSIS — Z90710 Acquired absence of both cervix and uterus: Secondary | ICD-10-CM | POA: Diagnosis not present

## 2024-01-05 DIAGNOSIS — Z8619 Personal history of other infectious and parasitic diseases: Secondary | ICD-10-CM | POA: Insufficient documentation

## 2024-01-05 DIAGNOSIS — N952 Postmenopausal atrophic vaginitis: Secondary | ICD-10-CM | POA: Insufficient documentation

## 2024-01-05 NOTE — Progress Notes (Signed)
 Gynecologic Oncology Return Clinic Visit  01/05/24  Reason for Visit: follow-up  Treatment History: The patient has a long standing history of abnormal paps and high risk HPV.   She had a pap in August 2016 which showed ASCUS with high risk HPV positive. Cytology was negaive in 2017 but high risk HPV remained present.  On 11/08/16 she underwent a pap which showed ASC-H with positive high risk HPV. Colposcopy of the vagina on 11/23/16 showed an agglutinated upper vagina and the cervix could not be visualized. However, there was an area of increased pigmentation of the vagina just under the fusion at 6 o'clock and a biopsy was taken. There was decreased lugols staining throughout the vagina and a biopsy was taken at 12 o'clock.   These biopsies revealed CIN 2-3.   A TVUS was obtained on 12/20/16 to identify the cervix as none could be seen on exam. The US  showed a 6.8x3.6x3.2cm uterus with a thin endometrium. The cervix was grossly normal on US .    She was treated with 5-FU vaginally for 2 months in December and January, 2019. She then underwent repeat pap on 06/15/17 which showed VAIN/CIN 2-3 with high risk HPV detected.    She underwent robotic assisted modified radical (Type II) hysterectomy and upper vaginectomy on 08/15/17. Final pathology showed a focal high grade squamous intrapeithelial lesions (CIN III) in the cervix, but negative vaginal margins. The ovaries and tubes were benign.   Renee Watkins had follow-up with Dr Jannis, initially having normal paps after her procedure.   Vaginal pap on 11/14/19 showed HGSIL.  Vaginal biopsy on 11/27/19 showed atrophic squamous mucosa.  No interventions took place after these results.    Seen in 07/2020 -normal colposcopic exam.  No lesion amenable to resection or laser.  Plan was to treat with 6 months of vaginal estrogen and repeat Pap in November.  If high-grade dysplasia persisted, consideration for either 5-FU or laser of the entire vaginal mucosa was  discussed.   01/20/21: Vaginal pap showed HSIL   02/10/21: Vaginoscopy performed with no significant decreased uptake after application of Lugol's.  Random biopsy taken from the midportion of the cuff.  Biopsy showed the VAIN 2-3.   04/27/2021: Vaginal biopsies, CO2 laser of the vagina.  Findings were notable for shortened vaginal canal, significant atrophy and scarring at vaginal apex.  Minimal diffuse decreased uptake noted after application of Lugol's.  No visible lesions.   05/2022: Pap NIML, HR HPV negative.   06/30/23: ASCUS pap, HR HPV +. 08/04/23: colposcopy with no concerning areas noted.  Interval History: Doing well.  Denies any vaginal bleeding or discharge.  Denies pelvic pain.  Continues to take acyclovir as needed for any symptoms.  Past Medical/Surgical History: Past Medical History:  Diagnosis Date   Abnormal Pap smear of cervix 09/30/2013   neg pap HPV HR +, 8-10-16ASCUS HPV HR+, 8/17 neg HPV HR+   Bilateral artificial lens implant    Cellulitis and abscess of left leg 01/2022   Hypertension    Osteoporosis    STD (sexually transmitted disease)    HSV   Vaginal intraepithelial neoplasia III (VAIN III)     Past Surgical History:  Procedure Laterality Date   Base Wedge Osteomy Right 03/10/10   right foot   BUNIONECTOMY Right 03/08/12   Keller right foot   Capsulotomy IPJ Right 03/08/12   Right foot 2   CATARACT EXTRACTION     both eyes   CO2 LASER APPLICATION N/A 04/27/2021  Procedure: CO2 LASER APPLICATION TO VAGINA, VAGINAL BIOPSIES;  Surgeon: Viktoria Comer SAUNDERS, MD;  Location: [facility];  Service: Gynecology;  Laterality: N/A;   COLONOSCOPY WITH PROPOFOL  N/A 07/08/2014   Procedure: COLONOSCOPY WITH PROPOFOL ;  Surgeon: Gladis MARLA Louder, MD;  Location: [facility];  Service: Endoscopy;  Laterality: N/A;   COLPOSCOPY     2015, ECC neg, polyp neg, 2016 & 2017   Cotton Ost w/graft Right 03/10/10   right foot   FOOT SURGERY Right 2000, [id number]    FOOT SURGERY Left 2002, 2006, 2010   Hammer toe repair Right 03/10/10   right foot # 5   Hammer toe repair Left 03/02/11   left foot 3 . 5   MANDIBLE FRACTURE SURGERY     ROBOTIC ASSISTED TOTAL HYSTERECTOMY N/A 08/15/2017   Procedure: XI ROBOTIC ASSISTED MODIFIED RADICAL TOTAL HYSTERECTOMY WITH UPPER VAGINECTOMY;  Surgeon: Eloy Herring, MD;  Location: [facility];  Service: Gynecology;  Laterality: N/A;   TUBAL LIGATION      Family History  Problem Relation Age of Onset   Hypertension Mother    Hypertension Sister    Leukemia Sister    Breast cancer Sister    Hypertension Brother    Colon cancer Neg Hx    Ovarian cancer Neg Hx    Endometrial cancer Neg Hx    Pancreatic cancer Neg Hx    Prostate cancer Neg Hx     Social History   Socioeconomic History   Marital status: Divorced    Spouse name: Not on file   Number of children: Not on file   Years of education: Not on file   Highest education level: Not on file  Occupational History   Not on file  Tobacco Use   Smoking status: Never   Smokeless tobacco: Never  Vaping Use   Vaping status: Never Used  Substance and Sexual Activity   Alcohol use: No   Drug use: No   Sexual activity: Not Currently    Birth control/protection: Surgical    Comment: BTL, hysterectomy  Other Topics Concern   Not on file  Social History Narrative   Not on file   Social Drivers of Health   Financial Resource Strain: Not on file  Food Insecurity: Not on file  Transportation Needs: Not on file  Physical Activity: Not on file  Stress: Not on file  Social Connections: Not on file    Current Medications:  Current Outpatient Medications:    acetaminophen  (TYLENOL ) 500 MG tablet, Take 500 mg by mouth every 6 (six) hours as needed., Disp: , Rfl:    denosumab  (PROLIA ) 60 MG/ML SOSY injection, 60mg  Subcutaneous once in 180 days; Duration: 180 days, Disp: , Rfl:    estradiol  (ESTRACE ) 0.1 MG/GM vaginal cream, PLACE 1 APPLICATORFUL VAGINALLY 3  (THREE) TIMES A WEEK., Disp: 42.5 g, Rfl: 12   furosemide (LASIX) 40 MG tablet, Take 40 mg by mouth daily., Disp: , Rfl:    KLOR-CON  M10 10 MEQ tablet, Take 10 mEq by mouth daily., Disp: , Rfl:    lisinopril  (PRINIVIL ,ZESTRIL ) 10 MG tablet, Take 10 mg by mouth every morning. , Disp: , Rfl:    Multiple Vitamins-Minerals (OCUVITE PO), Take by mouth daily., Disp: , Rfl:    simvastatin (ZOCOR) 40 MG tablet, Take 40 mg by mouth daily., Disp: , Rfl:    valACYclovir  (VALTREX ) 500 MG tablet, Take 1 tablet (500 mg total) by mouth 2 (two) times daily., Disp: 18 tablet, Rfl: 4  Review of Systems: Denies appetite changes, fevers, chills, fatigue, unexplained weight changes. Denies hearing loss, neck lumps or masses, mouth sores, ringing in ears or voice changes. Denies cough or wheezing.  Denies shortness of breath. Denies chest pain or palpitations. Denies leg swelling. Denies abdominal distention, pain, blood in stools, constipation, diarrhea, nausea, vomiting, or early satiety. Denies pain with intercourse, dysuria, frequency, hematuria or incontinence. Denies hot flashes, pelvic pain, vaginal bleeding or vaginal discharge.   Denies joint pain, back pain or muscle pain/cramps. Denies itching, rash, or wounds. Denies dizziness, headaches, numbness or seizures. Denies swollen lymph nodes or glands, denies easy bruising or bleeding. Denies anxiety, depression, confusion, or decreased concentration.  Physical Exam: BP 131/60 (BP Location: Right Arm, Patient Position: Sitting)   Temp 97.7 F (36.5 C) (Oral)   Wt 110 lb 6.4 oz (50.1 kg)   LMP 03/22/2003 (Exact Date)   SpO2 100%   BMI 19.76 kg/m  General: Alert, oriented, no acute distress. HEENT: Normocephalic, atraumatic, sclera anicteric. Chest: Unlabored breathing on room air. Extremities: Grossly normal range of motion.  Warm, well perfused.  No edema bilaterally. Skin: No rashes or lesions noted. GU: Normal-appearing external genitalia  without erythema, excoriation, or lesions.  On speculum exam, vagina is foreshortened, approximately 4 cm, narrow, and moderately atrophic.  Scarring noted at the apex.  No visible lesions noted.  On bimanual exam, no nodularity or masses appreciated at the cuff.  Laboratory & Radiologic Studies: None new  Assessment & Plan: Renee Watkins is a 78 y.o. woman with long history outlined above of vaginal dysplasia with recent Pap smear showing high-grade lesion, biopsy confirmed VAIN 2-3 last in 02/2021, now status post vaginal CO2 laser ablation in 04/2021. 06/2023: Pap ASCUS, HR HPV +. Colposcopy without lesions.   Patient is doing well, is NED on exam today.  Continues to use vaginal estrogen.   Pap/HPV due in 06/2023.   Plan to see her back in 6 months.  Reviewed symptoms that should prompt a phone call before her neck scheduled visit.  20 minutes of total time was spent for this patient encounter, including preparation, face-to-face counseling with the patient and coordination of care, and documentation of the encounter.  Comer Dollar, MD  Division of Gynecologic Oncology  Department of Obstetrics and Gynecology  [facility]

## 2024-01-05 NOTE — Patient Instructions (Signed)
 It was good to see you today.  I do not see or feel any evidence of cancer recurrence on your exam.  I will see you for follow-up in 6 months.  As always, if you develop any new and concerning symptoms before your next visit, please call to see me sooner.

## 2024-07-12 ENCOUNTER — Inpatient Hospital Stay: Admitting: Gynecologic Oncology
